# Patient Record
Sex: Female | Born: 1950 | Race: White | Hispanic: No | Marital: Married | Smoking: Former smoker
Health system: Southern US, Community
[De-identification: ages and names within clinical notes are randomized; demographics above are authoritative.]

## PROBLEM LIST (undated history)

## (undated) DIAGNOSIS — I10 Essential (primary) hypertension: Secondary | ICD-10-CM

## (undated) DIAGNOSIS — E28319 Asymptomatic premature menopause: Secondary | ICD-10-CM

## (undated) DIAGNOSIS — E785 Hyperlipidemia, unspecified: Secondary | ICD-10-CM

## (undated) DIAGNOSIS — F329 Major depressive disorder, single episode, unspecified: Secondary | ICD-10-CM

## (undated) DIAGNOSIS — M1990 Unspecified osteoarthritis, unspecified site: Secondary | ICD-10-CM

## (undated) DIAGNOSIS — F32A Depression, unspecified: Secondary | ICD-10-CM

## (undated) HISTORY — DX: Asymptomatic premature menopause: E28.319

## (undated) HISTORY — DX: Unspecified osteoarthritis, unspecified site: M19.90

## (undated) HISTORY — DX: Major depressive disorder, single episode, unspecified: F32.9

## (undated) HISTORY — DX: Essential (primary) hypertension: I10

## (undated) HISTORY — DX: Depression, unspecified: F32.A

## (undated) HISTORY — DX: Hyperlipidemia, unspecified: E78.5

---

## 1975-11-24 HISTORY — PX: OVARIAN CYST SURGERY: SHX726

## 2008-01-31 ENCOUNTER — Ambulatory Visit: Payer: Self-pay

## 2009-04-25 ENCOUNTER — Ambulatory Visit: Payer: Self-pay

## 2009-11-23 HISTORY — PX: COLONOSCOPY: SHX174

## 2009-12-05 LAB — HM COLONOSCOPY

## 2009-12-31 ENCOUNTER — Ambulatory Visit: Payer: Self-pay | Admitting: Family Medicine

## 2010-01-20 ENCOUNTER — Ambulatory Visit: Payer: Self-pay | Admitting: Gastroenterology

## 2010-02-04 ENCOUNTER — Other Ambulatory Visit: Payer: Self-pay | Admitting: Nurse Practitioner

## 2010-06-03 ENCOUNTER — Ambulatory Visit: Payer: Self-pay

## 2010-07-05 LAB — HM PAP SMEAR: HM Pap smear: NORMAL

## 2011-06-17 ENCOUNTER — Ambulatory Visit: Payer: Self-pay | Admitting: Family Medicine

## 2011-07-06 LAB — HM MAMMOGRAPHY: HM Mammogram: NORMAL

## 2011-11-26 ENCOUNTER — Ambulatory Visit: Payer: Self-pay | Admitting: Otolaryngology

## 2012-02-22 HISTORY — PX: NASAL SINUS SURGERY: SHX719

## 2012-06-02 LAB — HM PAP SMEAR: HM Pap smear: NORMAL

## 2012-06-02 LAB — HM MAMMOGRAPHY: HM Mammogram: NORMAL

## 2012-07-05 ENCOUNTER — Encounter: Payer: Self-pay | Admitting: Internal Medicine

## 2012-07-05 ENCOUNTER — Other Ambulatory Visit: Payer: Self-pay | Admitting: Internal Medicine

## 2012-07-05 ENCOUNTER — Ambulatory Visit (INDEPENDENT_AMBULATORY_CARE_PROVIDER_SITE_OTHER): Payer: BC Managed Care – PPO | Admitting: Internal Medicine

## 2012-07-05 VITALS — BP 104/64 | HR 74 | Temp 97.4°F | Ht 65.75 in | Wt 184.8 lb

## 2012-07-05 DIAGNOSIS — I10 Essential (primary) hypertension: Secondary | ICD-10-CM | POA: Insufficient documentation

## 2012-07-05 DIAGNOSIS — M25559 Pain in unspecified hip: Secondary | ICD-10-CM

## 2012-07-05 DIAGNOSIS — F329 Major depressive disorder, single episode, unspecified: Secondary | ICD-10-CM | POA: Insufficient documentation

## 2012-07-05 DIAGNOSIS — E28319 Asymptomatic premature menopause: Secondary | ICD-10-CM | POA: Insufficient documentation

## 2012-07-05 DIAGNOSIS — M25551 Pain in right hip: Secondary | ICD-10-CM | POA: Insufficient documentation

## 2012-07-05 DIAGNOSIS — R490 Dysphonia: Secondary | ICD-10-CM | POA: Insufficient documentation

## 2012-07-05 MED ORDER — VENLAFAXINE HCL ER 75 MG PO CP24: 75.0000 mg | ORAL_CAPSULE | Freq: Every day | ORAL | Status: DC

## 2012-07-05 MED ORDER — AMLODIPINE BESYLATE 5 MG PO TABS: 5.0000 mg | ORAL_TABLET | Freq: Every day | ORAL | Status: DC

## 2012-07-05 MED ORDER — VALSARTAN-HYDROCHLOROTHIAZIDE 320-25 MG PO TABS: 1.0000 | ORAL_TABLET | Freq: Every day | ORAL | Status: DC

## 2012-07-05 NOTE — Assessment & Plan Note (Signed)
Well-controlled on current medications no changes today. Records requested from Dr. Rhunette Croft office. Return in the fall for renal function and recheck.

## 2012-07-05 NOTE — Telephone Encounter (Signed)
Pt forgot to tell dr Darrick Huntsman she needs refill on all her meds [facility] in [place]

## 2012-07-05 NOTE — Assessment & Plan Note (Signed)
Chronic per patient. Given her history of smoking I suggested that she be evaluated. Her she has recently had  multiple ENT evaluations followed by sinus surgery by Dr. Gertie Baron.

## 2012-07-05 NOTE — Assessment & Plan Note (Signed)
Chronic low-level managed with Effexor on a stable dose for the past 8 years. No changes today

## 2012-07-05 NOTE — Assessment & Plan Note (Signed)
Infrequent and episodic aggravated only by long walks on hard surfaces. Range of motion is normal. No further workup at this time.

## 2012-07-05 NOTE — Progress Notes (Signed)
Patient ID: Caitlin Herring, female   DOB: 1950/12/26, 61 y.o.   MRN: [id number]  Patient Active Problem List  Diagnosis  . Early menopause  . Depression  . Hypertension  . Hoarse voice quality  . Hip pain, right    Subjective:  CC:   Chief Complaint  Patient presents with  . Establish Care    HPI:   Caitlin Herring is a 61 y.o. female who presents as a new patient to establish primary care with the chief complaint of   Past Medical History  Diagnosis Date  . Early menopause     at 43   . Depression     managed with effexor, stable dose 8 yrs     Past Surgical History  Procedure Date  . Nasal sinus surgery APRIL 2013    [facility]  . Ovarian cyst surgery 1977    benign    Family History  Problem Relation Age of Onset  . Arthritis Mother   . Hypertension Mother   . Mental illness Mother     depression  . Arthritis Father   . Hypertension Father     History   Social History  . Marital Status: Married    Spouse Name: N/A    Number of Children: N/A  . Years of Education: N/A   Occupational History  . Not on file.   Social History Main Topics  . Smoking status: Current Some Day Smoker    Types: Cigarettes  . Smokeless tobacco: Not on file  . Alcohol Use: No  . Drug Use: Not on file  . Sexually Active: Not on file   Other Topics Concern  . Not on file   Social History Narrative  . No narrative on file   No Known Allergies  Review of Systems: Review of Systems  Constitutional: Negative for fever, chills and weight loss.  HENT: Negative for nosebleeds, sore throat and neck pain.   Eyes: Negative for blurred vision, double vision and redness.  Respiratory: Negative for cough, shortness of breath and wheezing.   Cardiovascular: Negative for chest pain, palpitations, claudication and leg swelling.  Gastrointestinal: Negative for heartburn, abdominal pain and constipation.  Genitourinary: Positive for urgency. Negative for dysuria.    Musculoskeletal: Positive for joint pain. Negative for myalgias and falls.  Skin: Negative for rash.  Neurological: Negative for dizziness, tremors, focal weakness and headaches.  Endo/Heme/Allergies: Does not bruise/bleed easily.  Psychiatric/Behavioral: Negative for suicidal ideas and memory loss. The patient does not have insomnia.       Objective:  BP 104/64  Pulse 74  Temp 97.4 F (36.3 C) (Oral)  Ht 5' 5.75" (1.67 m)  Wt 184 lb 12 oz (83.802 kg)  BMI 30.05 kg/m2  SpO2 97%  General appearance: alert, cooperative and appears stated age Ears: normal TM's and external ear canals both ears Throat: lips, mucosa, and tongue normal; teeth and gums normal Neck: no adenopathy, no carotid bruit, supple, symmetrical, trachea midline and thyroid not enlarged, symmetric, no tenderness/mass/nodules Back: symmetric, no curvature. ROM normal. No CVA tenderness. Lungs: clear to auscultation bilaterally Heart: regular rate and rhythm, S1, S2 normal, no murmur, click, rub or gallop Abdomen: soft, non-tender; bowel sounds normal; no masses,  no organomegaly Pulses: 2+ and symmetric Skin: Skin color, texture, turgor normal. No rashes or lesions Lymph nodes: Cervical, supraclavicular, and axillary nodes normal.  Assessment and Plan:  Hypertension Well-controlled on current medications no changes today. Records requested from Dr. Rhunette Croft office. Return in the  fall for renal function and recheck.  Depression Chronic low-level managed with Effexor on a stable dose for the past 8 years. No changes today  Hoarse voice quality Chronic per patient. Given her history of smoking I suggested that she be evaluated. Her she has recently had  multiple ENT evaluations followed by sinus surgery by Dr. Gertie Baron.  Hip pain, right Infrequent and episodic aggravated only by long walks on hard surfaces. Range of motion is normal. No further workup at this time.   Updated Medication  List Outpatient Encounter Prescriptions as of 07/05/2012  Medication Sig Dispense Refill  . calcium carbonate (OS-CAL) 600 MG TABS Take 600 mg by mouth daily.      . cholecalciferol (VITAMIN D) 1000 UNITS tablet Take 1,000 Units by mouth daily.      . Coenzyme Q10 (COQ-10) 200 MG CAPS Take 1 capsule by mouth daily.      . Multiple Vitamin (MULTIVITAMIN) tablet Take 1 tablet by mouth daily.      Marland Kitchen DISCONTD: amLODipine (NORVASC) 5 MG tablet Take 5 mg by mouth daily.      Marland Kitchen DISCONTD: valsartan-hydrochlorothiazide (DIOVAN-HCT) 320-25 MG per tablet Take 1 tablet by mouth daily.      Marland Kitchen DISCONTD: venlafaxine XR (EFFEXOR-XR) 75 MG 24 hr capsule Take 75 mg by mouth daily.         Orders Placed This Encounter  Procedures  . HM MAMMOGRAPHY  . HM DEXA SCAN  . HM PAP SMEAR  . HM COLONOSCOPY    No Follow-up on file.

## 2012-07-05 NOTE — Patient Instructions (Addendum)
Return in 6 months for next visit,  Fasting labs 2 days prior    Consider a Low Glycemic Index Diet and eating 6 smaller meals daily .  This frequent feeding stimulates your metabolism and the lower glycemic index foods will lower your blood sugars:   This is an example of my daily  "Low GI"  Diet:  All of the foods can be found at grocery stores and in bulk at [employer]  club   7 AM Breakfast:  Low carbohydrate Protein  Shakes (I recommend the EAS AdvantEdge "Carb Control" shakes  Or the low carb shakes by Atkins.   Both are available everywhere:  In  cases at [employer]Js  Or in 4 packs at grocery stores and pharmacies  2.5 carbs  (Alternative is  a toasted Arnold's Sandwhich Thin w/ peanut butter, a "Bagel Thin" with cream cheese and salmon) or  a scrambled egg burrito made with a low carb tortilla .  Avoid cereal and bananas, oatmeal too unless the old fashioned kind that takes 30-40 minutes to prepare.  the rest is overly processed, has minimal fiber, and loaded with carbohydrates!   10 AM: Protein bar by Atkins (the snack size, under 200 cal.  There are many varieties , available widely again or in bulk in limited varieties at [employer]Js)  Other so called "protein bars" tend to be loaded with carbohydrates.  Remember, in food advertising, the word "energy" is synonymous for " carbohydrate."  Lunch: sandwich of [place], (or any lunchmeat or canned tuna), fresh avocado and cheese on a lower carbohydrate pita bread, flatbread, or tortilla . Ok to use mayonnaise. The bread is the only source or carbohydrate that can be decreased (Joseph's makes a pita bread and a flat bread  Are 50 cal and 4 net carbs ; Toufayan makes a low carb flatbread 100 cal and 9 net carbs  and  Mission makes a low carb whole wheat tortilla  210 cal and 6 net carbs)  3 PM:  Mid day :  Another protein bar,  Or a  cheese stick (100 cal, 0 carbs),  Or 1 ounce of  almonds, walnuts, pistachios, pecans, peanuts,  Macadamia nuts. Or a Dannon light n Fit  greek yogurt, 80 cal 8 net carbs . Avoid "granola"; the dried cranberries and raisins are loaded with carbohydrates.    6 PM  Dinner:  "mean and green:"  Meat/chicken/fish or a high protein legume; , with a green salad, and a low GI  Veggie (broccoli, cauliflower, green beans, spinach, brussel sprouts. Lima beans) : Avoid "Low fat dressings, Reyne Dumas and [address]! They are loaded with sugar! Instead use ranch, vinagrette,  Blue cheese, etc  9 PM snack : Breyer's "low carb" fudgsicle or  ice cream bar (Carb Smart line), or  Weight Watcher's ice cream bar , or anouther "no sugar added" ice cream; or another protein shake or a serving of fresh fruit with whipped cream (Avoid bananas, pineapple, grapes  and watermelon on a regular basis because they are high in sugar)   Remember that snack Substitutions should be less than 15 to 20 carbs  Per serving. Remember to subtract fiber grams to get the "net carbs."

## 2012-08-01 LAB — HM PAP SMEAR: HM Pap smear: NORMAL

## 2012-09-27 ENCOUNTER — Ambulatory Visit: Payer: Self-pay

## 2013-01-03 ENCOUNTER — Telehealth: Payer: Self-pay | Admitting: *Deleted

## 2013-01-03 DIAGNOSIS — I10 Essential (primary) hypertension: Secondary | ICD-10-CM

## 2013-01-03 DIAGNOSIS — Z13220 Encounter for screening for lipoid disorders: Secondary | ICD-10-CM

## 2013-01-03 DIAGNOSIS — R5381 Other malaise: Secondary | ICD-10-CM

## 2013-01-03 NOTE — Addendum Note (Signed)
Addended by: Sherlene Shams on: 01/03/2013 12:27 PM   Modules accepted: Orders

## 2013-01-03 NOTE — Telephone Encounter (Signed)
Pt is coming in for labs tomorrow (02.12.2014) what labs and dx would you like?

## 2013-01-03 NOTE — Telephone Encounter (Signed)
Orders in thanks

## 2013-01-04 ENCOUNTER — Other Ambulatory Visit (INDEPENDENT_AMBULATORY_CARE_PROVIDER_SITE_OTHER): Payer: BC Managed Care – PPO

## 2013-01-04 DIAGNOSIS — I10 Essential (primary) hypertension: Secondary | ICD-10-CM

## 2013-01-04 DIAGNOSIS — R5381 Other malaise: Secondary | ICD-10-CM

## 2013-01-04 DIAGNOSIS — R5383 Other fatigue: Secondary | ICD-10-CM

## 2013-01-04 DIAGNOSIS — Z13220 Encounter for screening for lipoid disorders: Secondary | ICD-10-CM

## 2013-01-04 LAB — COMPREHENSIVE METABOLIC PANEL
ALT: 36 U/L — ABNORMAL HIGH (ref 0–35)
Alkaline Phosphatase: 62 U/L (ref 39–117)
Sodium: 137 mEq/L (ref 135–145)
Total Bilirubin: 0.6 mg/dL (ref 0.3–1.2)
Total Protein: 7.2 g/dL (ref 6.0–8.3)

## 2013-01-04 LAB — LIPID PANEL
HDL: 45.6 mg/dL (ref >39.00)
Total CHOL/HDL Ratio: 6
VLDL: 30.4 mg/dL (ref 0.0–40.0)

## 2013-01-06 ENCOUNTER — Encounter: Payer: BC Managed Care – PPO | Admitting: Internal Medicine

## 2013-01-18 ENCOUNTER — Other Ambulatory Visit: Payer: Self-pay | Admitting: *Deleted

## 2013-01-19 MED ORDER — VALSARTAN-HYDROCHLOROTHIAZIDE 320-25 MG PO TABS: 1.0000 | ORAL_TABLET | Freq: Every day | ORAL | Status: DC

## 2013-01-31 ENCOUNTER — Ambulatory Visit (INDEPENDENT_AMBULATORY_CARE_PROVIDER_SITE_OTHER): Payer: BC Managed Care – PPO | Admitting: Internal Medicine

## 2013-01-31 ENCOUNTER — Encounter: Payer: Self-pay | Admitting: Internal Medicine

## 2013-01-31 VITALS — BP 108/62 | HR 71 | Temp 98.1°F | Resp 16 | Ht 67.0 in | Wt 187.8 lb

## 2013-01-31 DIAGNOSIS — M25559 Pain in unspecified hip: Secondary | ICD-10-CM

## 2013-01-31 DIAGNOSIS — I10 Essential (primary) hypertension: Secondary | ICD-10-CM

## 2013-01-31 DIAGNOSIS — E119 Type 2 diabetes mellitus without complications: Secondary | ICD-10-CM | POA: Insufficient documentation

## 2013-01-31 DIAGNOSIS — E785 Hyperlipidemia, unspecified: Secondary | ICD-10-CM

## 2013-01-31 DIAGNOSIS — Z0000 Encounter for general adult medical examination without abnormal findings: Secondary | ICD-10-CM

## 2013-01-31 DIAGNOSIS — R7309 Other abnormal glucose: Secondary | ICD-10-CM

## 2013-01-31 LAB — COMPREHENSIVE METABOLIC PANEL
AST: 25 U/L (ref 0–37)
Albumin: 4.3 g/dL (ref 3.5–5.2)
Alkaline Phosphatase: 63 U/L (ref 39–117)
BUN: 19 mg/dL (ref 6–23)
Potassium: 4.4 mEq/L (ref 3.5–5.1)
Sodium: 137 mEq/L (ref 135–145)
Total Bilirubin: 0.9 mg/dL (ref 0.3–1.2)

## 2013-01-31 LAB — LIPID PANEL
Total CHOL/HDL Ratio: 6
Triglycerides: 274 mg/dL — ABNORMAL HIGH (ref 0.0–149.0)
VLDL: 54.8 mg/dL — ABNORMAL HIGH (ref 0.0–40.0)

## 2013-01-31 LAB — MICROALBUMIN / CREATININE URINE RATIO: Microalb, Ur: 0.9 mg/dL (ref 0.0–1.9)

## 2013-01-31 LAB — LDL CHOLESTEROL, DIRECT: Direct LDL: 182.8 mg/dL

## 2013-01-31 NOTE — Progress Notes (Signed)
Patient ID: Caitlin Herring, female   DOB: July 30, 1951, 62 y.o.   MRN: [id number]   Subjective:     Caitlin Herring is a 62 y.o. female and is here for a comprehensive physical exam. The patient reports no problems.  History   Social History  . Marital Status: Married    Spouse Name: N/A    Number of Children: N/A  . Years of Education: N/A   Occupational History  . Not on file.   Social History Main Topics  . Smoking status: Current Some Day Smoker    Types: Cigarettes  . Smokeless tobacco: Not on file  . Alcohol Use: No  . Drug Use: Not on file  . Sexually Active: Not on file   Other Topics Concern  . Not on file   Social History Narrative  . No narrative on file   Health Maintenance  Topic Date Due  . Influenza Vaccine  07/24/1950  . Tetanus/tdap  01/01/1969  . Zostavax  01/01/2010  . Mammogram  07/05/2013  . Pap Smear  07/05/2013  . Colonoscopy  12/06/2019    The following portions of the patient's history were reviewed and updated as appropriate: allergies, current medications, past family history, past medical history, past social history, past surgical history and problem list.  Review of Systems A comprehensive review of systems was negative.   Objective:   BP 108/62  Pulse 71  Temp(Src) 98.1 F (36.7 C) (Oral)  Resp 16  Ht 5\' 7"  (1.702 m)  Wt 187 lb 12 oz (85.163 kg)  BMI 29.4 kg/m2  SpO2 96%  General appearance: alert, cooperative and appears stated age Ears: normal TM's and external ear canals both ears Throat: lips, mucosa, and tongue normal; teeth and gums normal Neck: no adenopathy, no carotid bruit, supple, symmetrical, trachea midline and thyroid not enlarged, symmetric, no tenderness/mass/nodules Back: symmetric, no curvature. ROM normal. No CVA tenderness. Lungs: clear to auscultation bilaterally Heart: regular rate and rhythm, S1, S2 normal, no murmur, click, rub or gallop Abdomen: soft, non-tender; bowel sounds normal; no masses,  no  organomegaly Pulses: 2+ and symmetric Skin: Skin color, texture, turgor normal. No rashes or lesions Lymph nodes: Cervical, supraclavicular, and axillary nodes normal.   Assessment:   Hip pain, right Improved, managed with aleve 2 daily .  Walking more   Other and unspecified hyperlipidemia Addressed today.  Will repeat in 3 months after diet and exercise.   Blood glucose elevated a1c is 6.2 I have addressed  BMI and recommended a low glycemic index diet utilizing smaller more frequent meals to increase metabolism.  I have also recommended that patient start exercising with a goal of 30 minutes of aerobic exercise a minimum of 5 days per week. Screening for lipid disorders, thyroid and diabetes to be done today.    Routine general medical examination at a health care facility Annual comprehensive exam was done excluding breast, pelvic and PAP smear. All screenings have been addressed .   Hypertension Well controlled on current regimen. Renal function stable, no changes today.   Updated Medication List Outpatient Encounter Prescriptions as of 01/31/2013  Medication Sig Dispense Refill  . amLODipine (NORVASC) 5 MG tablet Take 1 tablet (5 mg total) by mouth daily.  30 tablet  6  . calcium carbonate (OS-CAL) 600 MG TABS Take 600 mg by mouth daily.      . cholecalciferol (VITAMIN D) 1000 UNITS tablet Take 1,000 Units by mouth daily.      . Coenzyme Q10 (  COQ-10) 200 MG CAPS Take 1 capsule by mouth daily.      . Multiple Vitamin (MULTIVITAMIN) tablet Take 1 tablet by mouth daily.      . Naproxen Sodium 220 MG CAPS Take 2 capsules by mouth daily.      . valsartan-hydrochlorothiazide (DIOVAN-HCT) 320-25 MG per tablet Take 1 tablet by mouth daily.  30 tablet  6  . venlafaxine XR (EFFEXOR-XR) 75 MG 24 hr capsule Take 1 capsule (75 mg total) by mouth daily.  30 capsule  2  . [DISCONTINUED] amLODipine (NORVASC) 5 MG tablet Take 1 tablet (5 mg total) by mouth daily.  30 tablet  4  .  [DISCONTINUED] valsartan-hydrochlorothiazide (DIOVAN-HCT) 320-25 MG per tablet Take 1 tablet by mouth daily.  30 tablet  6   No facility-administered encounter medications on file as of 01/31/2013.

## 2013-01-31 NOTE — Assessment & Plan Note (Signed)
Improved, managed with aleve 2 daily .  Walking more

## 2013-01-31 NOTE — Assessment & Plan Note (Signed)
a1c is 6.2 I have addressed  BMI and recommended a low glycemic index diet utilizing smaller more frequent meals to increase metabolism.  I have also recommended that patient start exercising with a goal of 30 minutes of aerobic exercise a minimum of 5 days per week. Screening for lipid disorders, thyroid and diabetes to be done today.

## 2013-01-31 NOTE — Assessment & Plan Note (Signed)
Annual comprehensive exam was done excluding breast, pelvic and PAP smear. All screenings have been addressed .  

## 2013-01-31 NOTE — Assessment & Plan Note (Addendum)
Addressed today.  Will repeat in 3 months after diet and exercise.

## 2013-01-31 NOTE — Patient Instructions (Addendum)
You can lower your BMI  by losing 10%  Of your current body weight over the next 6 months   We will repeat your cholesterol and Hgba1c in 3 months   This is  One version of a  "Low GI"  Diet:  It is not ultra low carb, but will still lower your blood sugars and allow you to lose 5 to 15lbs per month if you follow it carefully and combine it with 30 minutes of aeroric exercise 5 days per week .   All of the foods can be found at grocery stores and in bulk at [employer].  The Atkins protein bars and shakes are available in more varieties at [employer], [employer] and [employer].     7 AM Breakfast:  Low carbohydrate Protein  Shakes (I recommend the EAS AdvantEdge "Carb Control" shakes  Or the low carb shakes by Atkins.   Both are available everywhere:  In  cases at BJs  Or in 4 packs at grocery stores and pharmacies  2.5 carbs  (Alternative is  a toasted Arnold's Sandwhich Thin w/ peanut butter, a "Bagel Thin" with cream cheese and salmon) or  a scrambled egg burrito made with a low carb tortilla .  Avoid cereal and bananas, oatmeal too unless you are cooking the old fashioned kind that takes 30-40 minutes to prepare.  the rest is overly processed, has minimal fiber, and is loaded with carbohydrates!   10 AM: Protein bar by Atkins (the snack size, under 200 cal).  There are many varieties , available widely again or in bulk in limited varieties at BJs)  Other so called "protein bars" tend to be loaded with carbohydrates.  Remember, in food advertising, the word "energy" is synonymous for " carbohydrate."  Lunch: sandwich of [place], (or any lunchmeat, grilled meat or canned tuna), fresh avocado, mayonnaise  and cheese on a lower carbohydrate pita bread, flatbread, or tortilla . Ok to use regular mayonnaise. The bread is the only source or carbohydrate that can be decreased (Joseph's makes a pita bread and a flat bread that are 50 cal and 4 net carbs ; Toufayan makes a low carb flatbread that's 100 cal and 9 net  carbs  and  Mission makes a low carb whole wheat tortilla  That is 210 cal and 6 net carbs)  3 PM:  Mid day :  Another protein bar,  Or a  cheese stick (100 cal, 0 carbs),  Or 1 ounce of  almonds, walnuts, pistachios, pecans, peanuts,  Macadamia nuts. Or a Dannon light n Fit greek yogurt, 80 cal 8 net carbs . Avoid "granola"; the dried cranberries and raisins are loaded with carbohydrates. Mixed nuts ok if no raisins or cranberries or dried fruit.      6 PM  Dinner:  "mean and green:"  Meat/chicken/fish or a high protein legume; , with a green salad, and a low GI  Veggie (broccoli, cauliflower, green beans, spinach, brussel sprouts. Lima beans) : Avoid "Low fat dressings, as well as Reyne Dumas and [address]! They are loaded with sugar! Instead use ranch, vinagrette,  Blue cheese, etc.  There is a low carb pasta by Dreamfield's available at [facility] that is acceptable and tastes great. Try Michel Angelo's chicken piccata over low carb pasta. The chicken dish is 0 carbs, and can be found in frozen section at [employer]Js and [employer]. Also try [employer]" (pulled pork, no sauce,  0 carbs) and his pot roast.  both are in the refrigerated section at BJs   Dreamfield's makes a low carb pasta only 5 g/serving.  Available at all grocery stores,  And tastes like normal pasta  9 PM snack : Breyer's "low carb" fudgsicle or  ice cream bar (Carb Smart line), or  Weight Watcher's ice cream bar , or another "no sugar added" ice cream;a serving of fresh berries/cherries with whipped cream (Avoid bananas, pineapple, grapes  and watermelon on a regular basis because they are high in sugar)   Remember that snack Substitutions should be less than 10 carbs per serving and meals < 20 carbs. Remember to subtract fiber grams and sugar alcohols to get the "net carbs."

## 2013-01-31 NOTE — Assessment & Plan Note (Signed)
Well controlled on current regimen. Renal function stable, no changes today. 

## 2013-02-01 ENCOUNTER — Encounter: Payer: Self-pay | Admitting: Internal Medicine

## 2013-02-01 NOTE — Addendum Note (Signed)
Addended by: Sherlene Shams on: 02/01/2013 01:15 PM   Modules accepted: Orders

## 2013-02-07 ENCOUNTER — Other Ambulatory Visit: Payer: Self-pay | Admitting: Internal Medicine

## 2013-02-09 ENCOUNTER — Other Ambulatory Visit: Payer: Self-pay | Admitting: Internal Medicine

## 2013-02-09 NOTE — Telephone Encounter (Signed)
Phoned in on 3/20.

## 2013-02-09 NOTE — Telephone Encounter (Signed)
Med filled.  

## 2013-05-02 ENCOUNTER — Other Ambulatory Visit: Payer: BC Managed Care – PPO

## 2013-05-05 ENCOUNTER — Ambulatory Visit: Payer: BC Managed Care – PPO | Admitting: Internal Medicine

## 2013-05-10 ENCOUNTER — Other Ambulatory Visit: Payer: Self-pay | Admitting: *Deleted

## 2013-05-10 MED ORDER — AMLODIPINE BESYLATE 5 MG PO TABS: ORAL_TABLET | ORAL | Status: DC

## 2013-05-29 ENCOUNTER — Other Ambulatory Visit (INDEPENDENT_AMBULATORY_CARE_PROVIDER_SITE_OTHER): Payer: BC Managed Care – PPO

## 2013-05-29 DIAGNOSIS — E119 Type 2 diabetes mellitus without complications: Secondary | ICD-10-CM

## 2013-05-29 DIAGNOSIS — E785 Hyperlipidemia, unspecified: Secondary | ICD-10-CM

## 2013-05-29 LAB — COMPREHENSIVE METABOLIC PANEL
ALT: 33 U/L (ref 0–35)
AST: 27 U/L (ref 0–37)
Albumin: 4.4 g/dL (ref 3.5–5.2)
Alkaline Phosphatase: 68 U/L (ref 39–117)
BUN: 22 mg/dL (ref 6–23)
Calcium: 10 mg/dL (ref 8.4–10.5)
Chloride: 103 mEq/L (ref 96–112)
Potassium: 4.3 mEq/L (ref 3.5–5.1)
Sodium: 138 mEq/L (ref 135–145)

## 2013-05-29 LAB — HEMOGLOBIN A1C: Hgb A1c MFr Bld: 6.2 % (ref 4.6–6.5)

## 2013-05-29 LAB — LIPID PANEL
Total CHOL/HDL Ratio: 6
VLDL: 32.4 mg/dL (ref 0.0–40.0)

## 2013-05-31 ENCOUNTER — Encounter: Payer: BC Managed Care – PPO | Admitting: Internal Medicine

## 2013-06-07 ENCOUNTER — Ambulatory Visit (INDEPENDENT_AMBULATORY_CARE_PROVIDER_SITE_OTHER): Payer: BC Managed Care – PPO | Admitting: Internal Medicine

## 2013-06-07 ENCOUNTER — Encounter: Payer: Self-pay | Admitting: Internal Medicine

## 2013-06-07 VITALS — BP 108/74 | HR 68 | Temp 98.1°F | Resp 14 | Wt 179.8 lb

## 2013-06-07 DIAGNOSIS — R739 Hyperglycemia, unspecified: Secondary | ICD-10-CM

## 2013-06-07 DIAGNOSIS — E785 Hyperlipidemia, unspecified: Secondary | ICD-10-CM

## 2013-06-07 DIAGNOSIS — E663 Overweight: Secondary | ICD-10-CM

## 2013-06-07 DIAGNOSIS — R7309 Other abnormal glucose: Secondary | ICD-10-CM

## 2013-06-07 DIAGNOSIS — I10 Essential (primary) hypertension: Secondary | ICD-10-CM

## 2013-06-07 DIAGNOSIS — Z6825 Body mass index (BMI) 25.0-25.9, adult: Secondary | ICD-10-CM

## 2013-06-07 LAB — HM DIABETES FOOT EXAM: HM Diabetic Foot Exam: NORMAL

## 2013-06-07 MED ORDER — AMLODIPINE BESYLATE 5 MG PO TABS: ORAL_TABLET | ORAL | Status: DC

## 2013-06-07 MED ORDER — RED YEAST RICE 600 MG PO CAPS: 1.0000 | ORAL_CAPSULE | Freq: Two times a day (BID) | ORAL | Status: DC

## 2013-06-07 NOTE — Patient Instructions (Addendum)
You are doing great !!!!  I want you to try red yeast rice for your cholesterol 600 mg twice daily  For the next 3 months

## 2013-06-09 ENCOUNTER — Encounter: Payer: Self-pay | Admitting: Internal Medicine

## 2013-06-09 DIAGNOSIS — E663 Overweight: Secondary | ICD-10-CM | POA: Insufficient documentation

## 2013-06-09 NOTE — Progress Notes (Signed)
Patient ID: Caitlin Herring, female   DOB: 09-04-1951, 62 y.o.   MRN: [id number]   Patient Active Problem List   Diagnosis Date Noted  . Other and unspecified hyperlipidemia 01/31/2013  . Type II or unspecified type diabetes mellitus without mention of complication, not stated as uncontrolled 01/31/2013  . Routine general medical examination at a health care facility 01/31/2013  . Hypertension 07/05/2012  . Hoarse voice quality 07/05/2012  . Hip pain, right 07/05/2012  . Early menopause   . Depression     Subjective:  CC:   Chief Complaint  Patient presents with  . Follow-up    3 month    HPI:   Caitlin Herring a 62 y.o. female who presents for  Followup on hypertension, borderline diabetes/glucose intolerance and overweight. She has been losing weight intentionally with a 8 lb wt loss since april by following a low glycemic index diet and exercising regularly. She denies any joint pain, trouble sleeping, changes in bowel habits, medication intolerances, and abdominal pain.    Past Medical History  Diagnosis Date  . Early menopause     at 62   . Depression     managed with effexor, stable dose 8 yrs     Past Surgical History  Procedure Laterality Date  . Nasal sinus surgery  APRIL 2013    [facility]  . Ovarian cyst surgery  1977    benign       The following portions of the patient's history were reviewed and updated as appropriate: Allergies, current medications, and problem list.    Review of Systems:   12 Pt  review of systems was negative except those addressed in the HPI,     History   Social History  . Marital Status: Married    Spouse Name: N/A    Number of Children: N/A  . Years of Education: N/A   Occupational History  . Not on file.   Social History Main Topics  . Smoking status: Current Some Day Smoker    Types: Cigarettes  . Smokeless tobacco: Not on file  . Alcohol Use: No  . Drug Use: Not on file  . Sexually Active: Not on  file   Other Topics Concern  . Not on file   Social History Narrative  . No narrative on file    Objective:  BP 108/74  Pulse 68  Temp(Src) 98.1 F (36.7 C) (Oral)  Resp 14  Wt 179 lb 12 oz (81.534 kg)  BMI 28.15 kg/m2  SpO2 97%  General appearance: alert, cooperative and appears stated age Ears: normal TM's and external ear canals both ears Throat: lips, mucosa, and tongue normal; teeth and gums normal Neck: no adenopathy, no carotid bruit, supple, symmetrical, trachea midline and thyroid not enlarged, symmetric, no tenderness/mass/nodules Back: symmetric, no curvature. ROM normal. No CVA tenderness. Lungs: clear to auscultation bilaterally Heart: regular rate and rhythm, S1, S2 normal, no murmur, click, rub or gallop Abdomen: soft, non-tender; bowel sounds normal; no masses,  no organomegaly Pulses: 2+ and symmetric Skin: Skin color, texture, turgor normal. No rashes or lesions Lymph nodes: Cervical, supraclavicular, and axillary nodes normal. Foot exam:  Nails are well trimmed,  No callouses,  Sensation intact to microfilament   Assessment and Plan:  Other and unspecified hyperlipidemia Her LDL has risen despite reducing her triglycerides in losing weight. We discussed a trial of red yeast rice, followed by repeat fasting lipids in 3 months and escalation to statin if still elevated.  Hypertension Well controlled on current regimen. Renal function stable, no changes today.  Type II or unspecified type diabetes mellitus without mention of complication, not stated as uncontrolled Her fasting glucose was diagnostic at 1:30. Her A1c remains stable at 6.2. We are addressing her hyperlipidemia and her overweight with weight loss, low glycemic index diet, and red yeast rice. If LDL remains above 100 in 3 months we will recommend statin therapy. Daily aspirin recommended. She has had her annual eye exam.  Overweight (BMI 25.0-29.9) I have addressed  BMI and congratulated her  he ron wt loss.  Recommended a low glycemic index diet utilizing smaller more frequent meals to increase metabolism.  I have also recommended that patient start exercising with a goal of 30 minutes of aerobic exercise a minimum of 5 days per week.    Updated Medication List Outpatient Encounter Prescriptions as of 06/07/2013  Medication Sig Dispense Refill  . amLODipine (NORVASC) 5 MG tablet TAKE 1 TABLET BY MOUTH EVERY DAY  90 tablet  3  . calcium carbonate (OS-CAL) 600 MG TABS Take 600 mg by mouth daily.      . cholecalciferol (VITAMIN D) 1000 UNITS tablet Take 1,000 Units by mouth daily.      . Coenzyme Q10 (COQ-10) 200 MG CAPS Take 1 capsule by mouth daily.      . Multiple Vitamin (MULTIVITAMIN) tablet Take 1 tablet by mouth daily.      . Naproxen Sodium 220 MG CAPS Take 2 capsules by mouth daily.      . valsartan-hydrochlorothiazide (DIOVAN-HCT) 320-25 MG per tablet Take 1 tablet by mouth daily.  30 tablet  6  . venlafaxine XR (EFFEXOR-XR) 75 MG 24 hr capsule TAKE ONE CAPSULE BY MOUTH DAILY  90 capsule  0  . [DISCONTINUED] amLODipine (NORVASC) 5 MG tablet TAKE 1 TABLET BY MOUTH EVERY DAY  30 tablet  5  . Red Yeast Rice 600 MG CAPS Take 1 capsule (600 mg total) by mouth 2 (two) times daily.  180 capsule  0   No facility-administered encounter medications on file as of 06/07/2013.

## 2013-06-09 NOTE — Assessment & Plan Note (Signed)
Her LDL has risen despite reducing her triglycerides in losing weight. We discussed a trial of red yeast rice, followed by repeat fasting lipids in 3 months and escalation to statin if still elevated.

## 2013-06-09 NOTE — Assessment & Plan Note (Signed)
Well controlled on current regimen. Renal function stable, no changes today. 

## 2013-06-09 NOTE — Assessment & Plan Note (Signed)
I have addressed  BMI and congratulated her he ron wt loss.  Recommended a low glycemic index diet utilizing smaller more frequent meals to increase metabolism.  I have also recommended that patient start exercising with a goal of 30 minutes of aerobic exercise a minimum of 5 days per week.

## 2013-06-09 NOTE — Assessment & Plan Note (Addendum)
Her fasting glucose was diagnostic at 1:30. Her A1c remains stable at 6.2. We are addressing her hyperlipidemia and her overweight with weight loss, low glycemic index diet, and red yeast rice. If LDL remains above 100 in 3 months we will recommend statin therapy. Daily aspirin recommended. She has had her annual eye exam.

## 2013-08-03 ENCOUNTER — Other Ambulatory Visit: Payer: Self-pay | Admitting: Internal Medicine

## 2013-09-05 ENCOUNTER — Other Ambulatory Visit: Payer: BC Managed Care – PPO

## 2013-09-05 NOTE — Progress Notes (Signed)
This encounter was created in error - please disregard.

## 2013-09-11 ENCOUNTER — Ambulatory Visit: Payer: BC Managed Care – PPO | Admitting: Internal Medicine

## 2013-09-11 ENCOUNTER — Other Ambulatory Visit: Payer: BC Managed Care – PPO

## 2013-09-15 ENCOUNTER — Other Ambulatory Visit (INDEPENDENT_AMBULATORY_CARE_PROVIDER_SITE_OTHER): Payer: BC Managed Care – PPO

## 2013-09-15 DIAGNOSIS — R7309 Other abnormal glucose: Secondary | ICD-10-CM

## 2013-09-15 DIAGNOSIS — E663 Overweight: Secondary | ICD-10-CM

## 2013-09-15 DIAGNOSIS — R739 Hyperglycemia, unspecified: Secondary | ICD-10-CM

## 2013-09-15 DIAGNOSIS — Z6825 Body mass index (BMI) 25.0-25.9, adult: Secondary | ICD-10-CM

## 2013-09-15 DIAGNOSIS — E119 Type 2 diabetes mellitus without complications: Secondary | ICD-10-CM

## 2013-09-15 DIAGNOSIS — E785 Hyperlipidemia, unspecified: Secondary | ICD-10-CM

## 2013-09-15 LAB — COMPREHENSIVE METABOLIC PANEL
AST: 28 U/L (ref 0–37)
Albumin: 4.8 g/dL (ref 3.5–5.2)
Alkaline Phosphatase: 64 U/L (ref 39–117)
Chloride: 102 mEq/L (ref 96–112)
Potassium: 5.1 mEq/L (ref 3.5–5.1)
Sodium: 139 mEq/L (ref 135–145)
Total Protein: 8 g/dL (ref 6.0–8.3)

## 2013-09-15 LAB — LIPID PANEL: Cholesterol: 287 mg/dL — ABNORMAL HIGH (ref 0–200)

## 2013-09-17 ENCOUNTER — Encounter: Payer: Self-pay | Admitting: Internal Medicine

## 2013-09-18 ENCOUNTER — Ambulatory Visit (INDEPENDENT_AMBULATORY_CARE_PROVIDER_SITE_OTHER): Payer: BC Managed Care – PPO | Admitting: Internal Medicine

## 2013-09-18 ENCOUNTER — Encounter: Payer: Self-pay | Admitting: Internal Medicine

## 2013-09-18 VITALS — BP 142/84 | HR 76 | Temp 98.3°F | Resp 12 | Ht 67.0 in | Wt 179.8 lb

## 2013-09-18 DIAGNOSIS — I10 Essential (primary) hypertension: Secondary | ICD-10-CM

## 2013-09-18 DIAGNOSIS — E785 Hyperlipidemia, unspecified: Secondary | ICD-10-CM

## 2013-09-18 DIAGNOSIS — Z6825 Body mass index (BMI) 25.0-25.9, adult: Secondary | ICD-10-CM

## 2013-09-18 DIAGNOSIS — E119 Type 2 diabetes mellitus without complications: Secondary | ICD-10-CM

## 2013-09-18 DIAGNOSIS — Z1239 Encounter for other screening for malignant neoplasm of breast: Secondary | ICD-10-CM

## 2013-09-18 DIAGNOSIS — E663 Overweight: Secondary | ICD-10-CM

## 2013-09-18 MED ORDER — ATORVASTATIN CALCIUM 20 MG PO TABS: 20.0000 mg | ORAL_TABLET | Freq: Every day | ORAL | Status: DC

## 2013-09-18 MED ORDER — ZOSTER VACCINE LIVE 19400 UNT/0.65ML ~~LOC~~ SOLR: 0.6500 mL | Freq: Once | SUBCUTANEOUS | Status: AC

## 2013-09-18 NOTE — Progress Notes (Signed)
Patient ID: Caitlin Herring, female   DOB: 1951/10/21, 62 y.o.   MRN: [id number]   Patient Active Problem List   Diagnosis Date Noted  . Overweight (BMI 25.0-29.9) 06/09/2013  . Other and unspecified hyperlipidemia 01/31/2013  . Type II or unspecified type diabetes mellitus without mention of complication, not stated as uncontrolled 01/31/2013  . Routine general medical examination at a health care facility 01/31/2013  . Hypertension 07/05/2012  . Hoarse voice quality 07/05/2012  . Hip pain, right 07/05/2012  . Early menopause   . Depression     Subjective:  CC:   Chief Complaint  Patient presents with  . Follow-up    3 month    HPI:   Caitlin Herring a 62 y.o. female who presents for follow up on chronic issues including tobacco abuse, diet controlled diabetes and overweight.  She has history cigarette use from 4 today down to the day. 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   Past Medical History  Diagnosis Date  . Early menopause     at 54   . Depression     managed with effexor, stable dose 8 yrs     Past Surgical History  Procedure Laterality Date  . Nasal sinus surgery  APRIL 2013    [facility]  . Ovarian cyst surgery  1977    benign       The following portions of the patient's history were reviewed and updated as appropriate: Allergies, current medications, and problem list.    Review of Systems:   12 Pt  review of systems was negative except those addressed in the HPI,     History   Social History  . Marital Status: Married    Spouse Name: [name]    Number of Children: 2  . Years of Education: N/A   Occupational History  . librarian    Social History Main Topics  . Smoking status: Current Some Day Smoker -- 0.05 packs/day    Types: Cigarettes  . Smokeless tobacco: Not on file  . Alcohol Use: No  . Drug Use: Not on file  . Sexual  Activity: Not on file   Other Topics Concern  . Not on file   Social History Narrative  . No narrative on file    Objective:  Filed Vitals:   09/18/13 0847  BP: 142/84  Pulse: 76  Temp: 98.3 F (36.8 C)  Resp: 12     General appearance: alert, cooperative and appears stated age Ears: normal TM's and external ear canals both ears Throat: lips, mucosa, and tongue normal; teeth and gums normal Neck: no adenopathy, no carotid bruit, supple, symmetrical, trachea midline and thyroid not enlarged, symmetric, no tenderness/mass/nodules Back: symmetric, no curvature. ROM normal. No CVA tenderness. Lungs: clear to auscultation bilaterally Heart: regular rate and rhythm, S1, S2 normal, no murmur, click, rub or gallop Abdomen: soft, non-tender; bowel sounds normal; no masses,  no organomegaly Pulses: 2+ and symmetric Skin: Skin color, texture, turgor normal. No rashes or lesions Lymph nodes: Cervical, supraclavicular, and axillary nodes normal.  Assessment and Plan:  Other and unspecified hyperlipidemia 10 yr risk of CAD elevated based on Framingham risk calcaution. Statin therapy advised.  Trial of lipitor .  Return in 3 months   Type II or unspecified type diabetes mellitus without mention of complication, not stated as  uncontrolled diagnosed with fasting glucose of 126.    Hypertension Elevated today because [place] [address]. No changes today.Marland Kitchen    Overweight (BMI 25.0-29.9) I have addressed  BMI and recommended wt loss of 10% of body weigh over the next 6 months using a low glycemic index diet and regular exercise a minimum of 5 days per week.   A total of 25 minutes of face to face time was spent with patient more than half of which was spent in counselling and coordination of care    Updated Medication List Outpatient Encounter Prescriptions as of 09/18/2013  Medication Sig Dispense Refill  . amLODipine (NORVASC) 5 MG tablet TAKE 1 TABLET BY  MOUTH EVERY DAY  90 tablet  3  . calcium carbonate (OS-CAL) 600 MG TABS Take 600 mg by mouth daily.      . cholecalciferol (VITAMIN D) 1000 UNITS tablet Take 1,000 Units by mouth daily.      . Coenzyme Q10 (COQ-10) 200 MG CAPS Take 1 capsule by mouth daily.      . Multiple Vitamin (MULTIVITAMIN) tablet Take 1 tablet by mouth daily.      . Naproxen Sodium 220 MG CAPS Take 2 capsules by mouth daily.      . Red Yeast Rice 600 MG CAPS Take 1 capsule (600 mg total) by mouth 2 (two) times daily.  180 capsule  0  . valsartan-hydrochlorothiazide (DIOVAN-HCT) 320-25 MG per tablet Take 1 tablet by mouth daily.  30 tablet  6  . venlafaxine XR (EFFEXOR-XR) 75 MG 24 hr capsule TAKE ONE CAPSULE BY MOUTH EVERY DAY  90 capsule  0  . atorvastatin (LIPITOR) 20 MG tablet Take 1 tablet (20 mg total) by mouth daily.  90 tablet  3  . zoster vaccine live, PF, (ZOSTAVAX) 16109 UNT/0.65ML injection Inject 19,400 Units into the skin once.  1 each  0   No facility-administered encounter medications on file as of 09/18/2013.     No orders of the defined types were placed in this encounter.    No Follow-up on file.

## 2013-09-18 NOTE — Assessment & Plan Note (Signed)
I have addressed  BMI and recommended wt loss of 10% of body weigh over the next 6 months using a low glycemic index diet and regular exercise a minimum of 5 days per week.   

## 2013-09-18 NOTE — Assessment & Plan Note (Signed)
diagnosed with fasting glucose of 126.

## 2013-09-18 NOTE — Patient Instructions (Addendum)
You do need a pneumonia and a shingles vaccine   You do have diabetes technically because your fasting glucose is > 125   Zoster rx provided,  Not necessary for pneumonia  atorvastatin 20 mg daily for your  LDL.  Repeat labs in 3 months fasting along with urine   Try almond milk as a substitute for milk,  Low glycemic index giet to prevent need for diabetes medications

## 2013-09-18 NOTE — Assessment & Plan Note (Addendum)
Statin therapy advised.  Trial of lipitor .  Return in 3 months
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
## 2013-09-19 NOTE — Telephone Encounter (Signed)
Discussed at office visit 09/18/13

## 2013-10-06 ENCOUNTER — Other Ambulatory Visit: Payer: Self-pay | Admitting: Internal Medicine

## 2013-10-18 ENCOUNTER — Encounter: Payer: Self-pay | Admitting: Emergency Medicine

## 2013-11-03 ENCOUNTER — Other Ambulatory Visit: Payer: Self-pay | Admitting: Internal Medicine

## 2013-12-01 ENCOUNTER — Encounter: Payer: Self-pay | Admitting: Internal Medicine

## 2013-12-01 ENCOUNTER — Ambulatory Visit (INDEPENDENT_AMBULATORY_CARE_PROVIDER_SITE_OTHER): Payer: BC Managed Care – PPO | Admitting: Internal Medicine

## 2013-12-01 VITALS — BP 124/78 | HR 75 | Temp 97.9°F | Wt 183.0 lb

## 2013-12-01 DIAGNOSIS — J019 Acute sinusitis, unspecified: Secondary | ICD-10-CM

## 2013-12-01 DIAGNOSIS — B9689 Other specified bacterial agents as the cause of diseases classified elsewhere: Secondary | ICD-10-CM

## 2013-12-01 MED ORDER — AMOXICILLIN-POT CLAVULANATE 875-125 MG PO TABS: 1.0000 | ORAL_TABLET | Freq: Two times a day (BID) | ORAL | Status: DC

## 2013-12-01 NOTE — Patient Instructions (Signed)

## 2013-12-01 NOTE — Progress Notes (Signed)
Pre-visit discussion using our clinic review tool. No additional management support is needed unless otherwise documented below in the visit note.  

## 2013-12-01 NOTE — Progress Notes (Signed)
HPI  Pt presents to the clinic today with c/o cough, chest congestion and facial pain/pressures. She reports this started 3 weeks ago. The cough is productive of thick green mucous. She also has associated ear pain, headache and teeth pain. She denies fever, but has had fatigue and body aches. She has tried Mucinex, Aleve without much relief. She has had sick contacts. She quit smoking 4 weeks ago.  Review of Systems      Past Medical History  Diagnosis Date  . Early menopause     at [age]   . Depression     managed with effexor, stable dose 8 yrs     Family History  Problem Relation Age of Onset  . Arthritis Mother   . Hypertension Mother   . Mental illness Mother     depression  . Arthritis Father   . Hypertension Father     History   Social History  . Marital Status: Married    Spouse Name: Maisie Fushomas    Number of Children: 2  . Years of Education: N/A   Occupational History  . librarian    Social History Main Topics  . Smoking status: Current Some Day Smoker -- 0.05 packs/day    Types: Cigarettes  . Smokeless tobacco: Not on file  . Alcohol Use: No  . Drug Use: Not on file  . Sexual Activity: Not on file   Other Topics Concern  . Not on file   Social History Narrative  . No narrative on file    No Known Allergies   Constitutional: Positive headache, fatigue. Denies fever or abrupt weight changes.  HEENT:  Positive sore throat. Denies eye redness, eye pain, pressure behind the eyes, facial pain, nasal congestion, ear pain,ildup, runny nose or bloody nose. Respiratory: Positive cough. Denies difficulty breathing or shortness of breath.  Cardiovascular: Denies chest pain, chest tightness, palpitations or swelling in the hands or feet.   No other specific complaints in a complete review of systems (except as listed in HPI above).  Objective:   BP 124/78  Pulse 75  Temp(Src) 97.9 F (36.6 C) (Oral)  Wt 183 lb (83.008 kg)  SpO2 98% Wt Readings from Last 3  Encounters:  12/01/13 183 lb (83.008 kg)  09/18/13 179 lb 12 oz (81.534 kg)  06/07/13 179 lb 12 oz (81.534 kg)     General: Appears her stated age, well developed, well nourished in NAD. HEENT: Head: normal shape and size, bilateral maxillary sinus tendernss; Eyes: sclera white, no icterus, conjunctiva pink, PERRLA and EOMs intact; Ears: Tm's gray and intact, normal light reflex; Nose: mucosa boggy and moist, septum midline; Throat/Mouth: + PND. Teeth present, mucosa erythematous and moist, no exudate noted, no lesions or ulcerations noted.  Neck: Mild cervical lymphadenopathy. Neck supple, trachea midline. No massses, lumps or thyromegaly present.  Cardiovascular: Normal rate and rhythm. S1,S2 noted.  No murmur, rubs or gallops noted. No JVD or BLE edema. No carotid bruits noted. Pulmonary/Chest: Normal effort and positive vesicular breath sounds. No respiratory distress. No wheezes, rales or ronchi noted.      Assessment & Plan:   Acute Sinusitis  Get some rest and drink plenty of water Do salt water gargles for the sore throat Give duration of symptoms which seem to be getting worse, eRx for Augmentin BID x 10 days  RTC as needed or if symptoms persist.

## 2013-12-19 ENCOUNTER — Encounter: Payer: Self-pay | Admitting: Internal Medicine

## 2013-12-19 ENCOUNTER — Ambulatory Visit (INDEPENDENT_AMBULATORY_CARE_PROVIDER_SITE_OTHER): Payer: BC Managed Care – PPO | Admitting: Internal Medicine

## 2013-12-19 VITALS — BP 118/78 | HR 78 | Temp 98.0°F | Wt 182.2 lb

## 2013-12-19 DIAGNOSIS — R05 Cough: Secondary | ICD-10-CM

## 2013-12-19 DIAGNOSIS — R059 Cough, unspecified: Secondary | ICD-10-CM

## 2013-12-19 DIAGNOSIS — J209 Acute bronchitis, unspecified: Secondary | ICD-10-CM

## 2013-12-19 MED ORDER — AZITHROMYCIN 250 MG PO TABS: ORAL_TABLET | ORAL | Status: DC

## 2013-12-19 MED ORDER — HYDROCODONE-HOMATROPINE 5-1.5 MG/5ML PO SYRP: 5.0000 mL | ORAL_SOLUTION | Freq: Three times a day (TID) | ORAL | Status: DC | PRN

## 2013-12-19 NOTE — Progress Notes (Signed)
HPI  Pt presents to the clinic today to follow up sinusitis. She was seen 12/01/13 and diagnosed with acute sinusitis. She was given Augmentin at that time. She reports that she is feeling worse. She now has chest congestion and a productive cough of thick yellow mucous. She reports a heavy feeling in her chest, especially when she coughs. She denies fever, chills or body aches. She has no history of allergies or asthma.   Review of Systems      Past Medical History  Diagnosis Date  . Early menopause     at [age]   . Depression     managed with effexor, stable dose 8 yrs     Family History  Problem Relation Age of Onset  . Arthritis Mother   . Hypertension Mother   . Mental illness Mother     depression  . Arthritis Father   . Hypertension Father     History   Social History  . Marital Status: Married    Spouse Name: Maisie Fushomas    Number of Children: 2  . Years of Education: N/A   Occupational History  . librarian    Social History Main Topics  . Smoking status: Current Some Day Smoker -- 0.05 packs/day    Types: Cigarettes  . Smokeless tobacco: Not on file  . Alcohol Use: No  . Drug Use: Not on file  . Sexual Activity: Not on file   Other Topics Concern  . Not on file   Social History Narrative  . No narrative on file    No Known Allergies   Constitutional: Positive fatigue. Denies headache, fever or abrupt weight changes.  HEENT:  Positive sore throat, ear fullness. Denies eye redness, eye pain, pressure behind the eyes, facial pain, nasal congestion, ear pain, ringing in the ears, wax buildup, runny nose or bloody nose. Respiratory: Positive cough. Denies difficulty breathing or shortness of breath.  Cardiovascular: Denies chest pain, chest tightness, palpitations or swelling in the hands or feet.   No other specific complaints in a complete review of systems (except as listed in HPI above).  Objective:   BP 118/78  Pulse 78  Temp(Src) 98 F (36.7 C)  Wt  182 lb 4 oz (82.668 kg)  SpO2 98% Wt Readings from Last 3 Encounters:  12/19/13 182 lb 4 oz (82.668 kg)  12/01/13 183 lb (83.008 kg)  09/18/13 179 lb 12 oz (81.534 kg)     General: Appears her stated age, well developed, well nourished in NAD. HEENT: Head: normal shape and size; Eyes: sclera white, no icterus, conjunctiva pink, PERRLA and EOMs intact; Ears: Tm's gray and intact, normal light reflex; Nose: mucosa boggy and moist, septum midline; Throat/Mouth: + PND. Teeth present, mucosa erythematous and moist, no exudate noted, no lesions or ulcerations noted.  Neck: Neck supple, trachea midline. No massses, lumps or thyromegaly present.  Cardiovascular: Normal rate and rhythm. S1,S2 noted.  No murmur, rubs or gallops noted. No JVD or BLE edema. No carotid bruits noted. Pulmonary/Chest: Normal effort and scattered rhonchi in the RUL, RML. No respiratory distress. No wheezes, rales or ronchi noted.      Assessment & Plan:   Acute Bronchitis  Get some rest and drink plenty of water Do salt water gargles for the sore throat Given worsening symptoms, will give eRx for Azithromax x 5 days Rx for Hycodan cough syrup Try an OTC zyrtec for the ear fullness  RTC as needed or if symptoms persist.

## 2013-12-19 NOTE — Patient Instructions (Signed)
Acute Bronchitis Bronchitis is inflammation of the airways that extend from the windpipe into the lungs (bronchi). The inflammation often causes mucus to develop. This leads to a cough, which is the most common symptom of bronchitis.  In acute bronchitis, the condition usually develops suddenly and goes away over time, usually in a couple weeks. Smoking, allergies, and asthma can make bronchitis worse. Repeated episodes of bronchitis may cause further lung problems.  CAUSES Acute bronchitis is most often caused by the same virus that causes a cold. The virus can spread from person to person (contagious).  SIGNS AND SYMPTOMS   Cough.   Fever.   Coughing up mucus.   Body aches.   Chest congestion.   Chills.   Shortness of breath.   Sore throat.  DIAGNOSIS  Acute bronchitis is usually diagnosed through a physical exam. Tests, such as chest X-rays, are sometimes done to rule out other conditions.  TREATMENT  Acute bronchitis usually goes away in a couple weeks. Often times, no medical treatment is necessary. Medicines are sometimes given for relief of fever or cough. Antibiotics are usually not needed but may be prescribed in certain situations. In some cases, an inhaler may be recommended to help reduce shortness of breath and control the cough. A cool mist vaporizer may also be used to help thin bronchial secretions and make it easier to clear the chest.  HOME CARE INSTRUCTIONS  Get plenty of rest.   Drink enough fluids to keep your urine clear or pale yellow (unless you have a medical condition that requires fluid restriction). Increasing fluids may help thin your secretions and will prevent dehydration.   Only take over-the-counter or prescription medicines as directed by your health care provider.   Avoid smoking and secondhand smoke. Exposure to cigarette smoke or irritating chemicals will make bronchitis worse. If you are a smoker, consider using nicotine gum or skin  patches to help control withdrawal symptoms. Quitting smoking will help your lungs heal faster.   Reduce the chances of another bout of acute bronchitis by washing your hands frequently, avoiding people with cold symptoms, and trying not to touch your hands to your mouth, nose, or eyes.   Follow up with your health care provider as directed.  SEEK MEDICAL CARE IF: Your symptoms do not improve after 1 week of treatment.  SEEK IMMEDIATE MEDICAL CARE IF:  You develop an increased fever or chills.   You have chest pain.   You have severe shortness of breath.  You have bloody sputum.   You develop dehydration.  You develop fainting.  You develop repeated vomiting.  You develop a severe headache. MAKE SURE YOU:   Understand these instructions.  Will watch your condition.  Will get help right away if you are not doing well or get worse. Document Released: 12/17/2004 Document Revised: 07/12/2013 Document Reviewed: 05/02/2013 [employer] Patient Information 2014 [employer].  

## 2013-12-19 NOTE — Progress Notes (Signed)
Pre-visit discussion using our clinic review tool. No additional management support is needed unless otherwise documented below in the visit note.  

## 2013-12-22 ENCOUNTER — Telehealth: Payer: Self-pay | Admitting: Internal Medicine

## 2013-12-22 ENCOUNTER — Other Ambulatory Visit: Payer: BC Managed Care – PPO

## 2013-12-22 NOTE — Telephone Encounter (Signed)
Relevant patient education assigned to patient using Emmi. ° °

## 2013-12-27 ENCOUNTER — Ambulatory Visit: Payer: BC Managed Care – PPO | Admitting: Internal Medicine

## 2014-01-29 ENCOUNTER — Other Ambulatory Visit: Payer: Self-pay | Admitting: Internal Medicine

## 2014-04-01 ENCOUNTER — Other Ambulatory Visit: Payer: Self-pay | Admitting: Internal Medicine

## 2014-04-02 ENCOUNTER — Other Ambulatory Visit: Payer: Self-pay | Admitting: Internal Medicine

## 2014-04-27 ENCOUNTER — Other Ambulatory Visit: Payer: Self-pay | Admitting: Internal Medicine

## 2014-04-27 NOTE — Telephone Encounter (Signed)
Appt 05/11/14 

## 2014-05-08 ENCOUNTER — Telehealth: Payer: Self-pay | Admitting: *Deleted

## 2014-05-08 DIAGNOSIS — E119 Type 2 diabetes mellitus without complications: Secondary | ICD-10-CM

## 2014-05-08 DIAGNOSIS — E785 Hyperlipidemia, unspecified: Secondary | ICD-10-CM

## 2014-05-08 NOTE — Telephone Encounter (Signed)
Pt is coming in tomorrow what labs and dx?  

## 2014-05-09 ENCOUNTER — Other Ambulatory Visit (INDEPENDENT_AMBULATORY_CARE_PROVIDER_SITE_OTHER): Payer: BC Managed Care – PPO

## 2014-05-09 DIAGNOSIS — E119 Type 2 diabetes mellitus without complications: Secondary | ICD-10-CM

## 2014-05-09 DIAGNOSIS — E785 Hyperlipidemia, unspecified: Secondary | ICD-10-CM

## 2014-05-09 LAB — COMPREHENSIVE METABOLIC PANEL
ALBUMIN: 4.3 g/dL (ref 3.5–5.2)
ALT: 42 U/L — ABNORMAL HIGH (ref 0–35)
AST: 29 U/L (ref 0–37)
Alkaline Phosphatase: 73 U/L (ref 39–117)
BILIRUBIN TOTAL: 0.5 mg/dL (ref 0.2–1.2)
BUN: 20 mg/dL (ref 6–23)
CO2: 29 mEq/L (ref 19–32)
Calcium: 9.4 mg/dL (ref 8.4–10.5)
Chloride: 104 mEq/L (ref 96–112)
Creatinine, Ser: 0.7 mg/dL (ref 0.4–1.2)
GFR: 86.58 mL/min (ref >60.00)
GLUCOSE: 139 mg/dL — AB (ref 70–99)
Potassium: 4.2 mEq/L (ref 3.5–5.1)
SODIUM: 139 meq/L (ref 135–145)
TOTAL PROTEIN: 7.3 g/dL (ref 6.0–8.3)

## 2014-05-09 LAB — MICROALBUMIN / CREATININE URINE RATIO
CREATININE, U: 139.8 mg/dL
Microalb Creat Ratio: 0.6 mg/g (ref 0.0–30.0)
Microalb, Ur: 0.8 mg/dL (ref 0.0–1.9)

## 2014-05-09 LAB — LIPID PANEL
CHOLESTEROL: 173 mg/dL (ref 0–200)
HDL: 45.1 mg/dL (ref >39.00)
LDL Cholesterol: 98 mg/dL (ref 0–99)
NonHDL: 127.9
Total CHOL/HDL Ratio: 4
Triglycerides: 150 mg/dL — ABNORMAL HIGH (ref 0.0–149.0)
VLDL: 30 mg/dL (ref 0.0–40.0)

## 2014-05-09 LAB — HEMOGLOBIN A1C: Hgb A1c MFr Bld: 6.5 % (ref 4.6–6.5)

## 2014-05-10 ENCOUNTER — Encounter: Payer: Self-pay | Admitting: Internal Medicine

## 2014-05-11 ENCOUNTER — Ambulatory Visit: Payer: BC Managed Care – PPO | Admitting: Internal Medicine

## 2014-05-16 NOTE — Telephone Encounter (Signed)
Mailed unread message to pt  

## 2014-05-30 ENCOUNTER — Ambulatory Visit (INDEPENDENT_AMBULATORY_CARE_PROVIDER_SITE_OTHER): Payer: BC Managed Care – PPO | Admitting: Internal Medicine

## 2014-05-31 ENCOUNTER — Other Ambulatory Visit: Payer: Self-pay | Admitting: Internal Medicine

## 2014-05-31 MED ORDER — VALSARTAN-HYDROCHLOROTHIAZIDE 320-25 MG PO TABS: ORAL_TABLET | ORAL | Status: DC

## 2014-05-31 MED ORDER — VENLAFAXINE HCL ER 75 MG PO CP24: ORAL_CAPSULE | ORAL | Status: DC

## 2014-05-31 MED ORDER — AMLODIPINE BESYLATE 5 MG PO TABS: ORAL_TABLET | ORAL | Status: DC

## 2014-05-31 MED ORDER — ATORVASTATIN CALCIUM 20 MG PO TABS: 20.0000 mg | ORAL_TABLET | Freq: Every day | ORAL | Status: DC

## 2014-05-31 NOTE — Telephone Encounter (Signed)
Dr. Darrick Huntsmanullo gave verbal order to refill due to patient could not stay for appointment and had to reschedule.

## 2014-05-31 NOTE — Telephone Encounter (Signed)
Please advise this is the patient that had to reschedule I sent a 30 day supply as instructed. Patient next appointment is 06/29/14

## 2014-05-31 NOTE — Telephone Encounter (Signed)
Patient had labs so 90 days refill done

## 2014-06-17 ENCOUNTER — Other Ambulatory Visit: Payer: Self-pay | Admitting: Internal Medicine

## 2014-06-29 ENCOUNTER — Ambulatory Visit: Payer: BC Managed Care – PPO | Admitting: Internal Medicine

## 2014-06-30 ENCOUNTER — Other Ambulatory Visit: Payer: Self-pay | Admitting: Internal Medicine

## 2014-07-02 NOTE — Telephone Encounter (Signed)
Last refill 7.9.15.Last OV 1.27.15 for acute w/5 cancellations.  Next OV 8.24.15.  Please advise refills

## 2014-07-02 NOTE — Telephone Encounter (Signed)
Refill one 30 days only.  Has not been seen in over 6 months so needs a non acute office visit  To continue refills

## 2014-07-16 ENCOUNTER — Encounter: Payer: Self-pay | Admitting: Internal Medicine

## 2014-07-16 ENCOUNTER — Ambulatory Visit (INDEPENDENT_AMBULATORY_CARE_PROVIDER_SITE_OTHER): Payer: BC Managed Care – PPO | Admitting: Internal Medicine

## 2014-07-16 VITALS — BP 124/86 | HR 84 | Temp 97.9°F | Resp 16 | Ht 67.0 in | Wt 182.5 lb

## 2014-07-16 DIAGNOSIS — I10 Essential (primary) hypertension: Secondary | ICD-10-CM

## 2014-07-16 DIAGNOSIS — Z720 Tobacco use: Secondary | ICD-10-CM

## 2014-07-16 DIAGNOSIS — E663 Overweight: Secondary | ICD-10-CM

## 2014-07-16 DIAGNOSIS — F17200 Nicotine dependence, unspecified, uncomplicated: Secondary | ICD-10-CM

## 2014-07-16 DIAGNOSIS — Z23 Encounter for immunization: Secondary | ICD-10-CM

## 2014-07-16 DIAGNOSIS — E119 Type 2 diabetes mellitus without complications: Secondary | ICD-10-CM

## 2014-07-16 DIAGNOSIS — R197 Diarrhea, unspecified: Secondary | ICD-10-CM

## 2014-07-16 NOTE — Patient Instructions (Addendum)
I recommend getting the majority of your calcium and Vitamin D  through diet rather than supplements given the recent association of calcium supplements with increased coronary artery calcium scores (You need 1200 mg daily )   Unsweetened almond/coconut milk is a great low calorie low carb, cholesterol free  way to increase your dietary calcium and vitamin D.  Try the blue Colin Benton  Your diabetes and cholesterol are well controlled  Keep drinking the keffir  Unless you want to test your theory   Return for annual exam 3 months   Make sur eyo have had your eye exam !!!!!

## 2014-07-16 NOTE — Progress Notes (Signed)
Patient ID: Caitlin Herring, female   DOB: 06-Sep-1951, 63 y.o.   MRN: [id number]  Patient Active Problem List   Diagnosis Date Noted  . Frequent loose stools 07/17/2014  . Tobacco abuse 07/16/2014  . Overweight (BMI 25.0-29.9) 06/09/2013  . Other and unspecified hyperlipidemia 01/31/2013  . Type II or unspecified type diabetes mellitus without mention of complication, not stated as uncontrolled 01/31/2013  . Routine general medical examination at a health care facility 01/31/2013  . Hypertension 07/05/2012  . Hoarse voice quality 07/05/2012  . Hip pain, right 07/05/2012  . Early menopause   . Depression     Subjective:  CC:   Chief Complaint  Patient presents with  . Follow-up    Lab work     HPI:   Caitlin Herring is a 63 y.o. female who presents for Follow up on multiple chronic issues.  She was last seen Oct 2014.   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  Patient ahd additional assistance of son who left college to help care for Dad.    Loose stools.  She notes that her stools have been soft and twice daily and resemble " a cowpie":  Has been occurring l summer long, no fevers,  cramping or unintentional  wt loss,  And resolved for two weeks  while on vacation .  Has been taking a form of probiotic that she stopped while away on vacation.  Has not tried suspending it at home.  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   Not checking blood sugars. Diet is low glycemic most of the time.      Past Medical History  Diagnosis Date  . Early menopause     at 75   .  Depression     managed with effexor, stable dose 8 yrs     Past Surgical History  Procedure Laterality Date  . Nasal sinus surgery  APRIL 2013    [facility]  . Ovarian cyst surgery  1977    benign       The following portions of the patient's history were reviewed and updated as appropriate: Allergies, current medications, and problem list.    Review of Systems:   Patient denies headache, fevers, malaise, unintentional weight loss, skin rash, eye pain, sinus congestion and sinus pain, sore throat, dysphagia,  hemoptysis , cough, dyspnea, wheezing, chest pain, palpitations, orthopnea, edema, abdominal pain, nausea, melena, diarrhea, constipation, flank pain, dysuria, hematuria, urinary  Frequency, nocturia, numbness, tingling, seizures,  Focal weakness, Loss of consciousness,  Tremor, insomnia, depression, anxiety, and suicidal ideation.     History   Social History  . Marital Status: Married    Spouse Name: [name]    Number of Children: 2  . Years of Education: N/A   Occupational History  . librarian    Social History Main Topics  . Smoking status: Current Some Day Smoker -- 0.05 packs/day    Types: Cigarettes  . Smokeless tobacco: Not on file  . Alcohol Use: No  . Drug Use:  Not on file  . Sexual Activity: Not on file   Other Topics Concern  . Not on file   Social History Narrative  . No narrative on file    Objective:  Filed Vitals:   07/16/14 1712  BP: 124/86  Pulse: 84  Temp: 97.9 F (36.6 C)  Resp: 16     General appearance: alert, cooperative and appears stated age Ears: normal TM's and external ear canals both ears Throat: lips, mucosa, and tongue normal; teeth and gums normal Neck: no adenopathy, no carotid bruit, supple, symmetrical, trachea midline and thyroid not enlarged, symmetric, no tenderness/mass/nodules Back: symmetric, no curvature. ROM normal. No CVA tenderness. Lungs: clear to auscultation bilaterally Heart: regular rate and  rhythm, S1, S2 normal, no murmur, click, rub or gallop Abdomen: soft, non-tender; bowel sounds normal; no masses,  no organomegaly Pulses: 2+ and symmetric Skin: Skin color, texture, turgor normal. No rashes or lesions Lymph nodes: Cervical, supraclavicular, and axillary nodes normal. Foot exam:  Nails are well trimmed,  No callouses,  Sensation intact to microfilament   Assessment and Plan:  Tobacco abuse Quit for a long time,  Had a few since the school started.   Hypertension Diastolic is elevated today but home bos have been < 130/80,  No changes today.  Lab Results  Component Value Date   CREATININE 0.7 05/09/2014   Lab Results  Component Value Date   NA 139 05/09/2014   K 4.2 05/09/2014   CL 104 05/09/2014   CO2 29 05/09/2014     Type II or unspecified type diabetes mellitus without mention of complication, not stated as uncontrolled diagnosed with fasting glucose of 126.  Historically well-controlled on diet alone .  hemoglobin A1c has been consistently at or  less than 7.0 . Patient is up-to-date on eye exams and foot exam is normal today. Patient is due for urine microalbumin to creatinine ratio at next visit. Patient is tolerating statin therapy for CAD risk reduction and on ACE/ARB for reduction in proteinuria.  Current labs are pending   Lab Results  Component Value Date   HGBA1C 6.5 05/09/2014   Lab Results  Component Value Date   MICROALBUR 0.8 05/09/2014   Lab Results  Component Value Date   CHOL 173 05/09/2014   HDL 45.10 05/09/2014   LDLCALC 98 05/09/2014   LDLDIRECT 222.7 09/15/2013   TRIG 150.0* 05/09/2014   CHOLHDL 4 05/09/2014       Frequent loose stools May be in intolerance to keffir milk she is drinking.  Advised to suspend it for 2 weeks.   Overweight (BMI 25.0-29.9) I have addressed  BMI and recommended a low glycemic index diet utilizing smaller more frequent meals to increase metabolism.  I have also recommended that patient start exercising  with a goal of 30 minutes of aerobic exercise a minimum of 5 days per week.   A total of 40 minutes was spent with patient more than half of which was spent in counseling patient on the above mentioned issues , reviewing and explaining recent labs and imaging studies done, and coordination of care.  Updated Medication List Outpatient Encounter Prescriptions as of 07/16/2014  Medication Sig  . amLODipine (NORVASC) 5 MG tablet TAKE 1 TABLET BY MOUTH EVERY DAY  . atorvastatin (LIPITOR) 20 MG tablet TAKE 1 TABLET BY MOUTH EVERY DAY  . calcium carbonate (OS-CAL) 600 MG TABS Take 600 mg by mouth daily.  . Multiple Vitamin (MULTIVITAMIN) tablet Take 1 tablet by mouth daily.  Marland Kitchen  Naproxen Sodium 220 MG CAPS Take 2 capsules by mouth daily.  . valsartan-hydrochlorothiazide (DIOVAN-HCT) 320-25 MG per tablet 1 TABLET BY MOUTH EVERY DAY  . venlafaxine XR (EFFEXOR-XR) 75 MG 24 hr capsule TAKE ONE CAPSULE BY MOUTH EVERY DAY  . cholecalciferol (VITAMIN D) 1000 UNITS tablet Take 1,000 Units by mouth daily.  . Coenzyme Q10 (COQ-10) 200 MG CAPS Take 1 capsule by mouth daily.  Marland Kitchen zoster vaccine live, PF, (ZOSTAVAX) 35573 UNT/0.65ML injection Inject 19,400 Units into the skin once.  . [DISCONTINUED] amLODipine (NORVASC) 5 MG tablet TAKE 1 TABLET BY MOUTH EVERY DAY  . [DISCONTINUED] azithromycin (ZITHROMAX) 250 MG tablet Take 2 tabs today, then 1 tab daily x 4 days  . [DISCONTINUED] HYDROcodone-homatropine (HYCODAN) 5-1.5 MG/5ML syrup Take 5 mLs by mouth every 8 (eight) hours as needed for cough.  . [DISCONTINUED] Red Yeast Rice 600 MG CAPS Take 1 capsule (600 mg total) by mouth 2 (two) times daily.  . [DISCONTINUED] valsartan-hydrochlorothiazide (DIOVAN-HCT) 320-25 MG per tablet TAKE 1 TABLET BY MOUTH EVERY DAY     Orders Placed This Encounter  Procedures  . Pneumococcal conjugate vaccine 13-valent    Return in about 3 months (around 10/16/2014).

## 2014-07-16 NOTE — Progress Notes (Signed)
Pre-visit discussion using our clinic review tool. No additional management support is needed unless otherwise documented below in the visit note.  

## 2014-07-16 NOTE — Assessment & Plan Note (Signed)
Quit for a long time,  Had a few since the school started.

## 2014-07-17 DIAGNOSIS — R197 Diarrhea, unspecified: Secondary | ICD-10-CM | POA: Insufficient documentation

## 2014-07-17 NOTE — Assessment & Plan Note (Signed)
diagnosed with fasting glucose of 126.  Historically well-controlled on diet alone .  hemoglobin A1c has been consistently at or  less than 7.0 . Patient is up-to-date on eye exams and foot exam is normal today. Patient is due for urine microalbumin to creatinine ratio at next visit. Patient is tolerating statin therapy for CAD risk reduction and on ACE/ARB for reduction in proteinuria.  Current labs are pending   Lab Results  Component Value Date   HGBA1C 6.5 05/09/2014   Lab Results  Component Value Date   MICROALBUR 0.8 05/09/2014   Lab Results  Component Value Date   CHOL 173 05/09/2014   HDL 45.10 05/09/2014   LDLCALC 98 05/09/2014   LDLDIRECT 222.7 09/15/2013   TRIG 150.0* 05/09/2014   CHOLHDL 4 05/09/2014

## 2014-07-17 NOTE — Assessment & Plan Note (Signed)
May be in intolerance to keffir milk she is drinking.  Advised to suspend it for 2 weeks.

## 2014-07-17 NOTE — Assessment & Plan Note (Signed)
Diastolic is elevated today but home bos have been < 130/80,  No changes today.  Lab Results  Component Value Date   CREATININE 0.7 05/09/2014   Lab Results  Component Value Date   NA 139 05/09/2014   K 4.2 05/09/2014   CL 104 05/09/2014   CO2 29 05/09/2014

## 2014-07-17 NOTE — Assessment & Plan Note (Signed)
I have addressed  BMI and recommended a low glycemic index diet utilizing smaller more frequent meals to increase metabolism.  I have also recommended that patient start exercising with a goal of 30 minutes of aerobic exercise a minimum of 5 days per week.  

## 2014-08-07 ENCOUNTER — Other Ambulatory Visit: Payer: Self-pay | Admitting: Internal Medicine

## 2014-08-07 MED ORDER — VENLAFAXINE HCL ER 75 MG PO CP24: ORAL_CAPSULE | ORAL | Status: AC

## 2014-08-07 NOTE — Telephone Encounter (Signed)
Fax from pharmacy, requesting 90 day supply Venlafaxine. Rx sent to pharmacy by escript

## 2014-08-07 NOTE — Addendum Note (Signed)
Addended by: Chandra Batch E on: 08/07/2014 11:27 AM   Modules accepted: Orders

## 2014-09-11 ENCOUNTER — Other Ambulatory Visit: Payer: Self-pay | Admitting: Internal Medicine

## 2014-10-10 ENCOUNTER — Other Ambulatory Visit: Payer: BC Managed Care – PPO

## 2014-10-15 ENCOUNTER — Ambulatory Visit: Payer: Self-pay | Admitting: Ophthalmology

## 2014-10-17 ENCOUNTER — Ambulatory Visit: Payer: BC Managed Care – PPO | Admitting: Internal Medicine

## 2014-12-10 ENCOUNTER — Ambulatory Visit: Payer: Self-pay | Admitting: Ophthalmology

## 2015-01-19 ENCOUNTER — Other Ambulatory Visit: Payer: Self-pay | Admitting: Internal Medicine

## 2015-01-21 ENCOUNTER — Other Ambulatory Visit: Payer: Self-pay | Admitting: Internal Medicine

## 2015-02-22 ENCOUNTER — Other Ambulatory Visit: Payer: Self-pay | Admitting: Internal Medicine

## 2015-03-17 NOTE — Op Note (Signed)
PATIENT NAME:  Caitlin Herring, [name] MR#:  [id number] DATE OF BIRTH:  [birth year]  DATE OF PROCEDURE:  11/26/2011  PREOPERATIVE DIAGNOSIS: Complex left nasal lacrimal duct mass.   POSTOPERATIVE DIAGNOSIS: Complex left nasal lacrimal duct mass.   PROCEDURES:  1. Endoscopic Dacryocystorhinostomy (complex). 2. Image-guided sinus surgery.  3. Left anterior and posterior ethmoidectomy with tissue removal.  4. Left maxillary antrostomy with tissue removal.  5. Left frontal sinusotomy with tissue removal.   SURGEON: Zackery BarefootJ. Madison Acelynn Dejonge, M.D.   DESCRIPTION OF PROCEDURE: The patient was placed in the supine position on the operating room table. After general endotracheal anesthesia had been induced, the patient was turned 90 degrees counterclockwise from anesthesia and the nose was anesthetized with infraorbital nerve blocks and left greater palatine nerve blocks.   The image-guided sinus surgery system mask was attached, using Stryker navigation, and registration was carried out in the standard fashion using the appropriate fiduciary points. Calibration of the system was confirmed and extensive review of the CT scan in all three dimensions preoperatively and intraoperatively was carried out.  Each instrument was registered and confirmed for anatomic accuracy.  Attention was directed to the sinus surgery portion of the surgery. The left side was addressed first. The middle turbinate was taken down subtotally with a through-cutting forceps. The uncinate process was then identified and completely resected revealing the natural maxillary sinus ostium. The natural maxillary sinus ostium was progressively enlarged to create a large maxillary antrostomy, removing diseased tissue. The maxillary antrum was then irrigated copiously with saline. Attention was directed to the ethmoid sinuses where the ethmoid sinuses were taken down from anterior to posterior preserving the skull base and the lamina papyracea, removing  diseased tissue. After the ethmoid sinuses had been completely cleaned out of polypoid chronically inflamed mucosa preserving the mucosa on the medial side of the middle turbinate along the skull base and along the lamina papyracea. The polypoid tissue extended to the frontal recess, and it was removed to open the frontal recess maximally.  After the ethmoid, frontal and maxillary sinuses had been cleared, the Silastic stents were placed through the superior and inferior punctae and brought through the lacrimal sac intranasally using endoscopic visualization. The nasal lacrimal duct bony surface was taken down with endoscopic drill. The nasal lacrimal duct was then carefully and meticulously elevated from the bony canal to include the periosteum. This was sent for frozen section analysis which showed chronic inflammation, but no neoplasm. At this point, the Silastic stents were tied and secured to the septum with a single 3-0 nylon suture. Temporary Telfa pledgets were placed and tied over the columella. The patient was returned to anesthesia, allowed to emerge from anesthesia in the Operating Room, and taken to the Recovery Room in stable condition. There were no complications.   ADDENDUM: The left eye was protected with a corneal shield during the entire procedure.  ____________________________ J. Gertie BaronMadison Asad Keeven, MD [email or web address] D: 11/26/2011 21:22:13 ET T: 11/27/2011 10:09:02 ET JOB#: [id number]  cc: Zackery BarefootJ. Madison Halleigh Comes, MD, <Dictator> Wendee CoppJMADISON Melven Stockard MD ELECTRONICALLY SIGNED 01/01/2012 7:30

## 2015-03-30 ENCOUNTER — Other Ambulatory Visit: Payer: Self-pay | Admitting: Internal Medicine

## 2015-04-05 NOTE — Telephone Encounter (Signed)
Mailed unread message to pt  

## 2015-05-03 ENCOUNTER — Other Ambulatory Visit: Payer: Self-pay | Admitting: Internal Medicine

## 2015-05-06 NOTE — Telephone Encounter (Signed)
Called and left VM on need for appt and fasting lab appt

## 2015-05-09 NOTE — Telephone Encounter (Signed)
Left message for pt on need for appt and labs, Last labs 05/09/14, last appt 07/16/14. Pt has not called back to schedule appt. Refill?

## 2015-07-19 ENCOUNTER — Ambulatory Visit
Admission: EM | Admit: 2015-07-19 | Discharge: 2015-07-19 | Disposition: A | Discharge status: Home / Self Care | Payer: Medicare PPO | Attending: Family Medicine | Admitting: Family Medicine

## 2015-07-19 DIAGNOSIS — W458XXA Other foreign body or object entering through skin, initial encounter: Secondary | ICD-10-CM | POA: Diagnosis not present

## 2015-07-19 DIAGNOSIS — S6982XA Other specified injuries of left wrist, hand and finger(s), initial encounter: Secondary | ICD-10-CM | POA: Diagnosis not present

## 2015-07-19 DIAGNOSIS — Z23 Encounter for immunization: Secondary | ICD-10-CM | POA: Diagnosis not present

## 2015-07-19 DIAGNOSIS — S6992XA Unspecified injury of left wrist, hand and finger(s), initial encounter: Secondary | ICD-10-CM

## 2015-07-19 MED ORDER — TETANUS-DIPHTHERIA TOXOIDS TD 5-2 LFU IM INJ
0.5000 mL | INJECTION | Freq: Once | INTRAMUSCULAR | Status: AC
  Administered 2015-07-19: 0.5 mL via INTRAMUSCULAR

## 2015-07-19 NOTE — Discharge Instructions (Signed)
Keep area clean. Clean daily with soap and water. Apply topical antibiotic ointment such as Neosporin.  Follow-up with your primary care physician as needed. Return to the urgent care or go to ER for redness, drainage, swelling, new or worsening concerns.  Fish Hook Injury  A fish hook can cause a small cut or lesion that extends through all layers of the skin and into the subcutaneous tissue. Because of this, bacteria may get injected beneath the surface of the skin. A simple bandage (dressing) may be applied. This should be changed daily. Follow your caregiver's instructions regarding use of any antibacterial ointments.  Only take over-the-counter or prescription medicines for pain, discomfort, or fever as directed by your caregiver. If you did not receive a tetanus shot from your caregiver because you did not recall when your last one was given, make sure to check with your physician's office and determine if one is needed. Generally for a "dirty" wound, you should receive a tetanus booster if you have not had one in the last five years. If you have a "clean" wound, you should receive a tetanus booster if you have not had one within the last ten years. SEEK IMMEDIATE MEDICAL CARE IF:   You develop redness, swelling, or increasing pain in the wound.  You have a fever.  You notice a bad smell coming from the wound or dressing.  You notice pus or other unusual drainage coming from the wound. Document Released: 11/06/2000 Document Revised: 02/01/2012 Document Reviewed: 05/30/2009 [facility] Patient Information 2015 [place], [place]. This information is not intended to replace advice given to you by your health care provider. Make sure you discuss any questions you have with your health care provider.

## 2015-07-19 NOTE — ED Provider Notes (Signed)
[facility] Emergency Department Provider Note  ____________________________________________  Time seen: Approximately 7:02 PM  I have reviewed the triage vital signs and the nursing notes.   HISTORY  Chief Complaint Hand Pain   HPI Caitlin Herring is a 64 y.o. female presents for concern for tetanus immunization. Patient reports that yesterday afternoon she and her husband were fishing. Patient states that she went to hand her fishing pole over to her husband and the hook became caught into her left second finger. Patient reports that left second distal finger the barb of the hook went into her finger. States that initially it bled and her husband was able to pull hook out of finger at site. Patient states that immediately after pulling OUT she then washed and soaked her finger. Patient denies pain. Patient states that she has full sensation and full motion in finger. Denies other injury. Patient states that she is here today as she does not recall her last tetanus immunization and wants tetanus updated.  Patient again denies pain. Denies numbness or tingling sensation. Denies other injury.States hook was removed complete. Denies chance of retained foreign body, states "there is no way anything was left in there."  Past Medical History  Diagnosis Date  . Early menopause     at 50   . Depression     managed with effexor, stable dose 8 yrs     Patient Active Problem List   Diagnosis Date Noted  . Frequent loose stools 07/17/2014  . Tobacco abuse 07/16/2014  . Overweight (BMI 25.0-29.9) 06/09/2013  . Other and unspecified hyperlipidemia 01/31/2013  . Type II or unspecified type diabetes mellitus without mention of complication, not stated as uncontrolled 01/31/2013  . Routine general medical examination at a health care facility 01/31/2013  . Hypertension 07/05/2012  . Hoarse voice quality 07/05/2012  . Hip pain, right 07/05/2012  . Early menopause   .  Depression     Past Surgical History  Procedure Laterality Date  . Nasal sinus surgery  APRIL 2013    [facility]  . Ovarian cyst surgery  1977    benign    Current Outpatient Rx  Name  Route  Sig  Dispense  Refill  . amLODipine (NORVASC) 5 MG tablet      TAKE 1 TABLET BY MOUTH EVERY DAY   90 tablet   0   . atorvastatin (LIPITOR) 20 MG tablet      TAKE 1 TABLET BY MOUTH EVERY DAY   90 tablet   0     **Patient requests 90 days supply**   . calcium carbonate (OS-CAL) 600 MG TABS   Oral   Take 600 mg by mouth daily.         . cholecalciferol (VITAMIN D) 1000 UNITS tablet   Oral   Take 1,000 Units by mouth daily.         . Coenzyme Q10 (COQ-10) 200 MG CAPS   Oral   Take 1 capsule by mouth daily.         . Multiple Vitamin (MULTIVITAMIN) tablet   Oral   Take 1 tablet by mouth daily.         . Naproxen Sodium 220 MG CAPS   Oral   Take 2 capsules by mouth daily.         . valsartan-hydrochlorothiazide (DIOVAN-HCT) 320-25 MG per tablet      TAKE 1 TABLET BY MOUTH EVERY DAY   30 tablet   0  NEEDS OFFICE VISIT   AND PRIOR TO FURTHER REFILLS, ...   . venlafaxine XR (EFFEXOR-XR) 75 MG 24 hr capsule      TAKE 1 CAPSULE BY MOUTH EVERY DAY.   90 capsule   0   . atorvastatin (LIPITOR) 20 MG tablet      TAKE 1 TABLET BY MOUTH EVERY DAY   90 tablet   0   . valsartan-hydrochlorothiazide (DIOVAN-HCT) 320-25 MG per tablet      1 TABLET BY MOUTH EVERY DAY   30 tablet   0     NEEDS OFFICE VISIT PRIOR TO FURTHER REFILLS, PLEAS ...   . zoster vaccine live, PF, (ZOSTAVAX) 69629 UNT/0.65ML injection   Subcutaneous   Inject 19,400 Units into the skin once.   1 each   0     Allergies Review of patient's allergies indicates no known allergies.  Family History  Problem Relation Age of Onset  . Arthritis Mother   . Hypertension Mother   . Mental illness Mother     depression  . Arthritis Father   . Hypertension Father     Social  History Social History  Substance Use Topics  . Smoking status: Former Smoker -- 0.05 packs/day    Types: Cigarettes    Quit date: 05/10/2015  . Smokeless tobacco: Never Used  . Alcohol Use: No    Review of Systems Constitutional: No fever/chills Eyes: No visual changes. ENT: No sore throat. Cardiovascular: Denies chest pain. Respiratory: Denies shortness of breath. Gastrointestinal: No abdominal pain.  No nausea, no vomiting.  No diarrhea.  No constipation. Genitourinary: Negative for dysuria. Musculoskeletal: Negative for back pain. Skin: Negative for rash. Injury to left 2 finger as above.   Neurological: Negative for headaches, focal weakness or numbness.  10-point ROS otherwise negative.  ____________________________________________   PHYSICAL EXAM:  VITAL SIGNS: ED Triage Vitals  Enc Vitals Group     BP 07/19/15 1833 126/74 mmHg     Pulse Rate 07/19/15 1833 82     Resp 07/19/15 1833 16     Temp 07/19/15 1833 97.8 F (36.6 C)     Temp Source 07/19/15 1833 Oral     SpO2 07/19/15 1833 99 %     Weight 07/19/15 1833 178 lb (80.74 kg)     Height 07/19/15 1833 5\' 7"  (1.702 m)     Head Cir --      Peak Flow --      Pain Score --      Pain Loc --      Pain Edu? --      Excl. in GC? --     Constitutional: Alert and oriented. Well appearing and in no acute distress. Eyes: Conjunctivae are normal. PERRL. EOMI. Head: Atraumatic.  Nose: No congestion/rhinnorhea.  Mouth/Throat: Mucous membranes are moist.  Neck: No stridor.  No cervical spine tenderness to palpation. Hematological/Lymphatic/Immunilogical: No cervical lymphadenopathy. Cardiovascular: Normal rate, regular rhythm. Grossly normal heart sounds.  Good peripheral circulation. Respiratory: Normal respiratory effort.  No retractions. Lungs CTAB. Gastrointestinal: Soft and nontender. No distention.  Musculoskeletal: No lower or upper extremity tenderness nor edema.  No joint effusions. See skin below.   Neurologic:  Normal speech and language. No gross focal neurologic deficits are appreciated. No gait instability. Skin:  Skin is warm, dry and intact. No rash noted.  Except: left second distal phalanx palmer surface healing wound, with small abrasion  Present. No erythema, no drainage, no swelling, no signs of infection.  Left distal second finger full ROM, nontender, sensation and motor intact, no tendon or motor  deficit.  Cap refill <2 secs.  Psychiatric: Mood and affect are normal. Speech and behavior are normal.  __________________________________________   INITIAL IMPRESSION / ASSESSMENT AND PLAN / ED COURSE  Pertinent labs & imaging results that were available during my care of the patient were reviewed by me and considered in my medical decision making (see chart for details).  Very well-appearing patient. No acute distress. Presents to urgent care for the complaint of requesting a tetanus immunization. Patient states yesterday afternoon her husband were fishing and a fish hook barb became lodged in her left second finger. Patient states that husband was able to pull hook out intact. Patient denies pain. Denies numbness or difficulty with movement. Left distal second finger with healing wound with superficial abrasion present. Left second finger nontender with full range of motion. No signs of infection. Patient states that she does not want x-ray or antibiotics at this time, refuses xray and antibiotics at this time. States will return if becomes red or appears infected.  Tetanus immunization updated. Follow up with primary care physician as needed. Discussed follow-up and return parameters. Patient verbalized understanding and agreed to plan. ____________________________________________   FINAL CLINICAL IMPRESSION(S) / ED DIAGNOSES  Final diagnoses:  Immunization, tetanus toxoid  Fish hook injury of finger, left, initial encounter       Renford Dills, NP 07/19/15 2102

## 2015-07-19 NOTE — ED Notes (Signed)
Pt was fishing yesterday when a fishing hook punctured her left index finger. Pt reports blood was "spurting". Husband had to remove hook with pliers. Pt states the pain was about a 6/10. Pt is concerned because her last Tdap was about 10 years ago.

## 2016-12-12 DIAGNOSIS — M1990 Unspecified osteoarthritis, unspecified site: Secondary | ICD-10-CM | POA: Insufficient documentation

## 2017-01-15 ENCOUNTER — Other Ambulatory Visit: Payer: Self-pay | Admitting: Certified Nurse Midwife

## 2017-01-15 DIAGNOSIS — Z13820 Encounter for screening for osteoporosis: Secondary | ICD-10-CM

## 2017-01-15 DIAGNOSIS — Z1239 Encounter for other screening for malignant neoplasm of breast: Secondary | ICD-10-CM

## 2017-03-08 ENCOUNTER — Other Ambulatory Visit: Payer: Medicare PPO

## 2017-04-01 ENCOUNTER — Ambulatory Visit
Admission: RE | Admit: 2017-04-01 | Discharge: 2017-04-01 | Disposition: A | Discharge status: Home / Self Care | Payer: Medicare Other | Source: Ambulatory Visit | Attending: Certified Nurse Midwife | Admitting: Certified Nurse Midwife

## 2017-04-01 DIAGNOSIS — Z13820 Encounter for screening for osteoporosis: Secondary | ICD-10-CM

## 2017-04-01 DIAGNOSIS — Z1231 Encounter for screening mammogram for malignant neoplasm of breast: Secondary | ICD-10-CM | POA: Diagnosis present

## 2017-04-01 DIAGNOSIS — Z1239 Encounter for other screening for malignant neoplasm of breast: Secondary | ICD-10-CM

## 2017-04-05 ENCOUNTER — Other Ambulatory Visit: Payer: Self-pay | Admitting: *Deleted

## 2017-04-05 ENCOUNTER — Inpatient Hospital Stay
Admission: RE | Admit: 2017-04-05 | Discharge: 2017-04-05 | Disposition: A | Discharge status: Home / Self Care | Payer: Self-pay | Source: Ambulatory Visit | Attending: *Deleted | Admitting: *Deleted

## 2017-04-05 DIAGNOSIS — Z9289 Personal history of other medical treatment: Secondary | ICD-10-CM

## 2018-06-27 ENCOUNTER — Other Ambulatory Visit: Payer: Self-pay | Admitting: Certified Nurse Midwife

## 2018-06-27 ENCOUNTER — Other Ambulatory Visit: Payer: Self-pay | Admitting: Pediatrics

## 2018-06-27 DIAGNOSIS — Z1231 Encounter for screening mammogram for malignant neoplasm of breast: Secondary | ICD-10-CM

## 2018-08-08 ENCOUNTER — Ambulatory Visit
Admission: RE | Admit: 2018-08-08 | Discharge: 2018-08-08 | Disposition: A | Discharge status: Home / Self Care | Payer: Medicare Other | Source: Ambulatory Visit | Attending: Pediatrics | Admitting: Pediatrics

## 2018-08-08 DIAGNOSIS — Z1231 Encounter for screening mammogram for malignant neoplasm of breast: Secondary | ICD-10-CM

## 2018-10-25 ENCOUNTER — Other Ambulatory Visit: Payer: Self-pay | Admitting: Physician Assistant

## 2018-10-25 DIAGNOSIS — H9201 Otalgia, right ear: Secondary | ICD-10-CM

## 2018-11-01 ENCOUNTER — Other Ambulatory Visit
Admission: RE | Admit: 2018-11-01 | Discharge: 2018-11-01 | Disposition: A | Discharge status: Home / Self Care | Payer: Medicare Other | Source: Home / Self Care | Attending: Physician Assistant | Admitting: Physician Assistant

## 2018-11-01 ENCOUNTER — Ambulatory Visit
Admission: RE | Admit: 2018-11-01 | Discharge: 2018-11-01 | Disposition: A | Discharge status: Home / Self Care | Payer: Medicare Other | Source: Ambulatory Visit | Attending: Physician Assistant | Admitting: Physician Assistant

## 2018-11-01 DIAGNOSIS — H9201 Otalgia, right ear: Secondary | ICD-10-CM | POA: Diagnosis not present

## 2018-11-01 LAB — CREATININE, SERUM
CREATININE: 0.71 mg/dL (ref 0.44–1.00)
GFR calc Af Amer: >60 mL/min (ref >60)

## 2018-11-01 MED ORDER — IOHEXOL 300 MG/ML  SOLN
75.0000 mL | Freq: Once | INTRAMUSCULAR | Status: AC | PRN
  Administered 2018-11-01: 75 mL via INTRAVENOUS

## 2019-10-02 ENCOUNTER — Other Ambulatory Visit: Payer: Self-pay | Admitting: Pediatrics

## 2019-10-02 DIAGNOSIS — Z1231 Encounter for screening mammogram for malignant neoplasm of breast: Secondary | ICD-10-CM

## 2019-11-02 ENCOUNTER — Other Ambulatory Visit: Payer: Self-pay

## 2019-11-02 DIAGNOSIS — Z20822 Contact with and (suspected) exposure to covid-19: Secondary | ICD-10-CM

## 2019-11-04 LAB — NOVEL CORONAVIRUS, NAA: SARS-CoV-2, NAA: NOT DETECTED

## 2019-11-06 ENCOUNTER — Telehealth: Payer: Self-pay | Admitting: General Practice

## 2019-11-06 NOTE — Telephone Encounter (Signed)
Gave patient negative covid test results Patient understood 

## 2019-12-17 ENCOUNTER — Ambulatory Visit: Payer: Medicare PPO | Attending: Internal Medicine

## 2019-12-17 ENCOUNTER — Ambulatory Visit: Payer: Self-pay

## 2019-12-17 ENCOUNTER — Ambulatory Visit: Payer: Medicare PPO

## 2019-12-17 DIAGNOSIS — Z23 Encounter for immunization: Secondary | ICD-10-CM | POA: Insufficient documentation

## 2019-12-17 NOTE — Progress Notes (Signed)
   Covid-19 Vaccination Clinic  Name:  Caitlin Herring    MRN: [id number] DOB: 10-Mar-1951  12/17/2019  [name] was observed post Covid-19 immunization for 15 minutes without incidence. She was provided with Vaccine Information Sheet and instruction to access the V-Safe system.   [name] was instructed to call 911 with any severe reactions post vaccine: Marland Kitchen Difficulty breathing  . Swelling of your face and throat  . A fast heartbeat  . A bad rash all over your body  . Dizziness and weakness    Immunizations Administered    Name Date Dose VIS Date Route   Pfizer COVID-19 Vaccine 12/17/2019 10:59 AM 0.3 mL 11/03/2019 Intramuscular   Manufacturer: [employer], [employer]   Lot: [id number]   NDC: [address]-3

## 2019-12-19 ENCOUNTER — Ambulatory Visit: Payer: Self-pay

## 2020-01-08 ENCOUNTER — Ambulatory Visit: Payer: Medicare PPO | Attending: Internal Medicine

## 2020-01-08 DIAGNOSIS — Z23 Encounter for immunization: Secondary | ICD-10-CM

## 2020-01-08 NOTE — Progress Notes (Signed)
   Covid-19 Vaccination Clinic  Name:  Caitlin Herring    MRN: [id number] DOB: 12-30-1950  01/08/2020  Caitlin Herring was observed post Covid-19 immunization for 15 minutes without incidence. She was provided with Vaccine Information Sheet and instruction to access the V-Safe system.   Caitlin Herring was instructed to call 911 with any severe reactions post vaccine: Marland Kitchen Difficulty breathing  . Swelling of your face and throat  . A fast heartbeat  . A bad rash all over your body  . Dizziness and weakness    Immunizations Administered    Name Date Dose VIS Date Route   Pfizer COVID-19 Vaccine 01/08/2020  8:22 AM 0.3 mL 11/03/2019 Intramuscular   Manufacturer: [employer], [employer]   Lot: [id number]   NDC: [address]-9

## 2020-01-15 ENCOUNTER — Ambulatory Visit
Admission: RE | Admit: 2020-01-15 | Discharge: 2020-01-15 | Disposition: A | Discharge status: Home / Self Care | Payer: Medicare PPO | Source: Ambulatory Visit | Attending: Pediatrics | Admitting: Pediatrics

## 2020-01-15 DIAGNOSIS — Z1231 Encounter for screening mammogram for malignant neoplasm of breast: Secondary | ICD-10-CM

## 2020-01-16 ENCOUNTER — Ambulatory Visit: Payer: Medicare Other

## 2020-02-08 ENCOUNTER — Other Ambulatory Visit: Payer: Self-pay

## 2020-02-08 ENCOUNTER — Telehealth: Payer: Self-pay

## 2020-02-08 DIAGNOSIS — Z1211 Encounter for screening for malignant neoplasm of colon: Secondary | ICD-10-CM

## 2020-02-08 MED ORDER — NA SULFATE-K SULFATE-MG SULF 17.5-3.13-1.6 GM/177ML PO SOLN: 354.0000 mL | Freq: Once | ORAL | 0 refills | Status: AC

## 2020-02-08 NOTE — Telephone Encounter (Signed)
Gastroenterology Pre-Procedure Review    PATIENT REVIEW QUESTIONS: The patient responded to the following health history questions as indicated:    1. Are you having any GI issues? no 2. Do you have a personal history of Polyps? no 3. Do you have a family history of Colon Cancer or Polyps? no 4. 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 Joint replacements in the past 12 months?no 6. Major health problems in the past 3 months?no 7. Any artificial heart valves, MVP, or defibrillator?no    MEDICATIONS & ALLERGIES:    Patient reports the following regarding taking any anticoagulation/antiplatelet therapy:   Plavix, Coumadin, Eliquis, Xarelto, Lovenox, Pradaxa, Brilinta, or Effient? no Aspirin? Aspirin 81mg    Patient confirms/reports the following medications:  Current Outpatient Medications  Medication Sig Dispense Refill  . amLODipine (NORVASC) 5 MG tablet TAKE 1 TABLET BY MOUTH EVERY DAY 90 tablet 0  . atorvastatin (LIPITOR) 20 MG tablet TAKE 1 TABLET BY MOUTH EVERY DAY 90 tablet 0  . atorvastatin (LIPITOR) 20 MG tablet TAKE 1 TABLET BY MOUTH EVERY DAY 90 tablet 0  . calcium carbonate (OS-CAL) 600 MG TABS Take 600 mg by mouth daily.    . cholecalciferol (VITAMIN D) 1000 UNITS tablet Take 1,000 Units by mouth daily.    . Coenzyme Q10 (COQ-10) 200 MG CAPS Take 1 capsule by mouth daily.    . Multiple Vitamin (MULTIVITAMIN) tablet Take 1 tablet by mouth daily.    . Naproxen Sodium 220 MG CAPS Take 2 capsules by mouth daily.    . valsartan-hydrochlorothiazide (DIOVAN-HCT) 320-25 MG per tablet 1 TABLET BY MOUTH EVERY DAY 30 tablet 0  . valsartan-hydrochlorothiazide (DIOVAN-HCT) 320-25 MG per tablet TAKE 1 TABLET BY MOUTH EVERY DAY 30 tablet 0  . venlafaxine XR (EFFEXOR-XR) 75 MG 24 hr capsule TAKE 1 CAPSULE BY MOUTH EVERY DAY. 90 capsule 0  . zoster vaccine live, PF, (ZOSTAVAX) UNT/0.65ML injection Inject 19,400 Units into the skin once. 1 each 0   No current facility-administered medications  for this visit.    Patient confirms/reports the following allergies:  No Known Allergies  No orders of the defined types were placed in this encounter.   AUTHORIZATION INFORMATION Primary Insurance: 1D#: Group #:  Secondary Insurance: 1D#: Group #:  SCHEDULE INFORMATION: Date: 02/26/2020 Time: Location: [place]

## 2020-02-26 ENCOUNTER — Ambulatory Visit: Admit: 2020-02-26 | Payer: Medicare PPO | Admitting: Gastroenterology

## 2020-02-26 SURGERY — COLONOSCOPY WITH PROPOFOL
Anesthesia: General

## 2021-03-24 ENCOUNTER — Other Ambulatory Visit: Payer: Self-pay | Admitting: Pediatrics

## 2021-03-24 DIAGNOSIS — Z1231 Encounter for screening mammogram for malignant neoplasm of breast: Secondary | ICD-10-CM

## 2021-03-27 ENCOUNTER — Other Ambulatory Visit: Payer: Self-pay

## 2021-03-27 ENCOUNTER — Ambulatory Visit
Admission: RE | Admit: 2021-03-27 | Discharge: 2021-03-27 | Disposition: A | Discharge status: Home / Self Care | Payer: Medicare PPO | Source: Ambulatory Visit | Attending: Pediatrics | Admitting: Pediatrics

## 2021-03-27 DIAGNOSIS — Z1231 Encounter for screening mammogram for malignant neoplasm of breast: Secondary | ICD-10-CM | POA: Diagnosis not present

## 2022-03-03 ENCOUNTER — Other Ambulatory Visit: Payer: Self-pay | Admitting: Pediatrics

## 2022-03-03 DIAGNOSIS — Z1231 Encounter for screening mammogram for malignant neoplasm of breast: Secondary | ICD-10-CM

## 2022-04-07 ENCOUNTER — Other Ambulatory Visit: Payer: Self-pay | Admitting: Pediatrics

## 2022-04-07 DIAGNOSIS — Z780 Asymptomatic menopausal state: Secondary | ICD-10-CM

## 2022-04-28 ENCOUNTER — Ambulatory Visit
Admission: RE | Admit: 2022-04-28 | Discharge: 2022-04-28 | Disposition: A | Discharge status: Home / Self Care | Payer: Medicare PPO | Source: Ambulatory Visit | Attending: Pediatrics | Admitting: Pediatrics

## 2022-04-28 DIAGNOSIS — Z1231 Encounter for screening mammogram for malignant neoplasm of breast: Secondary | ICD-10-CM | POA: Insufficient documentation

## 2022-05-28 ENCOUNTER — Other Ambulatory Visit: Payer: Medicare PPO

## 2023-05-12 ENCOUNTER — Other Ambulatory Visit: Payer: Self-pay | Admitting: Pediatrics

## 2023-05-12 DIAGNOSIS — Z1231 Encounter for screening mammogram for malignant neoplasm of breast: Secondary | ICD-10-CM

## 2023-05-13 ENCOUNTER — Other Ambulatory Visit: Payer: Self-pay | Admitting: Pediatrics

## 2023-05-13 DIAGNOSIS — Z780 Asymptomatic menopausal state: Secondary | ICD-10-CM

## 2023-07-14 ENCOUNTER — Ambulatory Visit
Admission: RE | Admit: 2023-07-14 | Discharge: 2023-07-14 | Disposition: A | Discharge status: Home / Self Care | Payer: Medicare PPO | Source: Ambulatory Visit | Attending: Pediatrics | Admitting: Pediatrics

## 2023-07-14 DIAGNOSIS — Z780 Asymptomatic menopausal state: Secondary | ICD-10-CM | POA: Insufficient documentation

## 2023-07-14 DIAGNOSIS — Z1231 Encounter for screening mammogram for malignant neoplasm of breast: Secondary | ICD-10-CM

## 2023-12-11 IMAGING — MG MM DIGITAL SCREENING BILAT W/ TOMO AND CAD
8 series · 8 of 24 positions shown · non-contrast
Comparison: Previous exam(s).

CLINICAL DATA: Screening.

EXAM:
DIGITAL SCREENING BILATERAL MAMMOGRAM WITH TOMOSYNTHESIS AND CAD
TECHNIQUE: Bilateral screening digital craniocaudal and mediolateral oblique
mammograms were obtained. Bilateral screening digital breast
tomosynthesis was performed. The images were evaluated with
computer-aided detection.

[L MLO synth-2D]
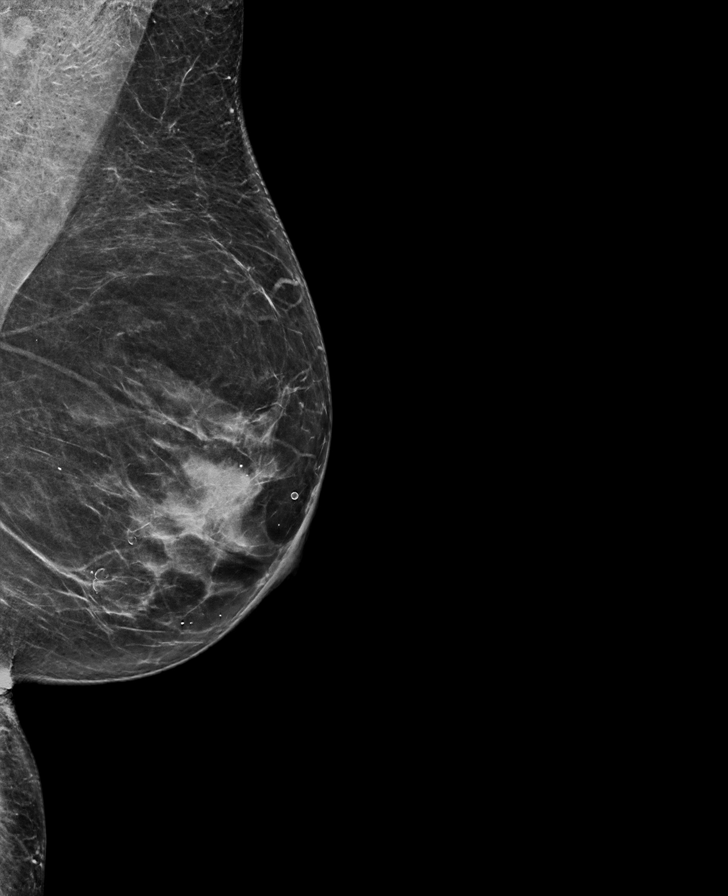

[R MLO synth-2D]
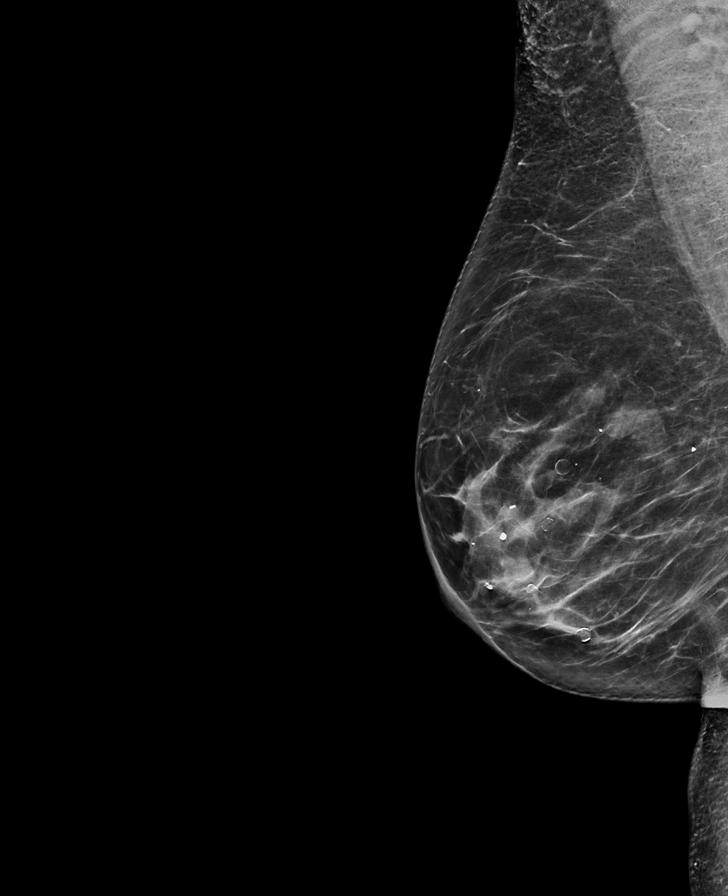

[R CC synth-2D]
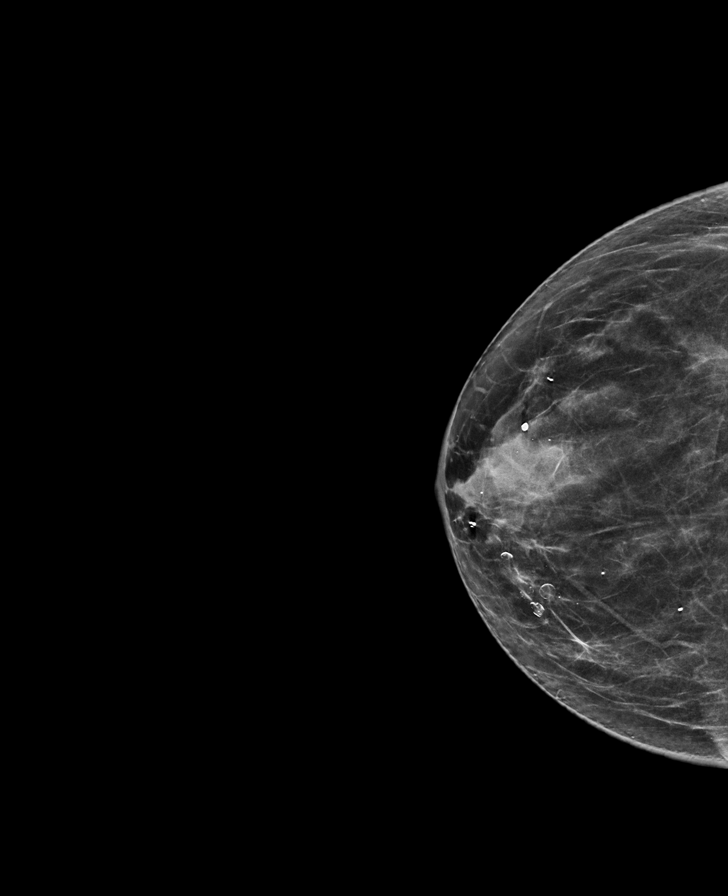

[L CC synth-2D]
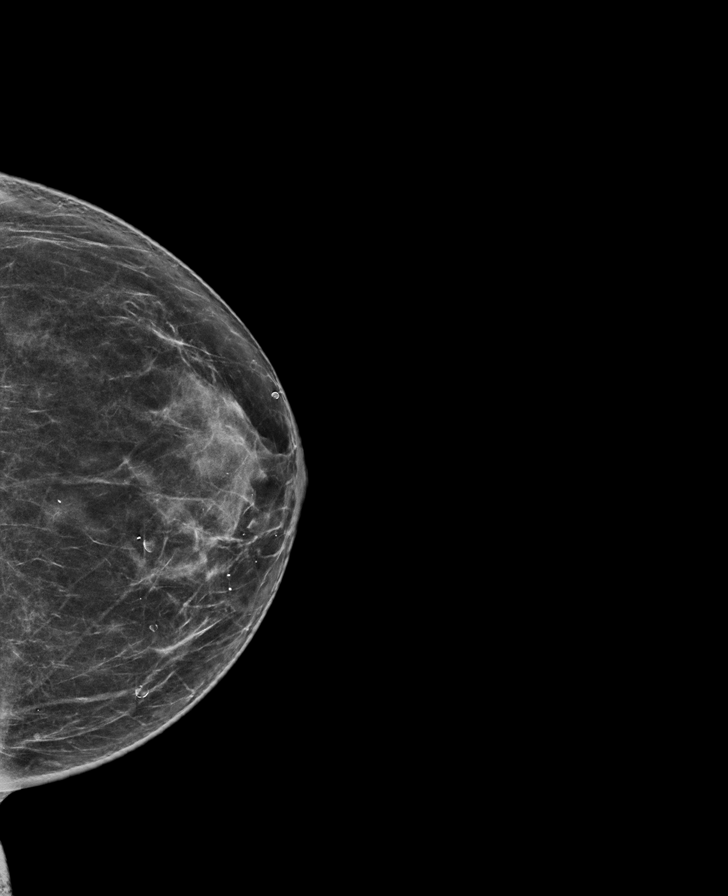

[L MLO tomo · tomo slice 37/73.0]
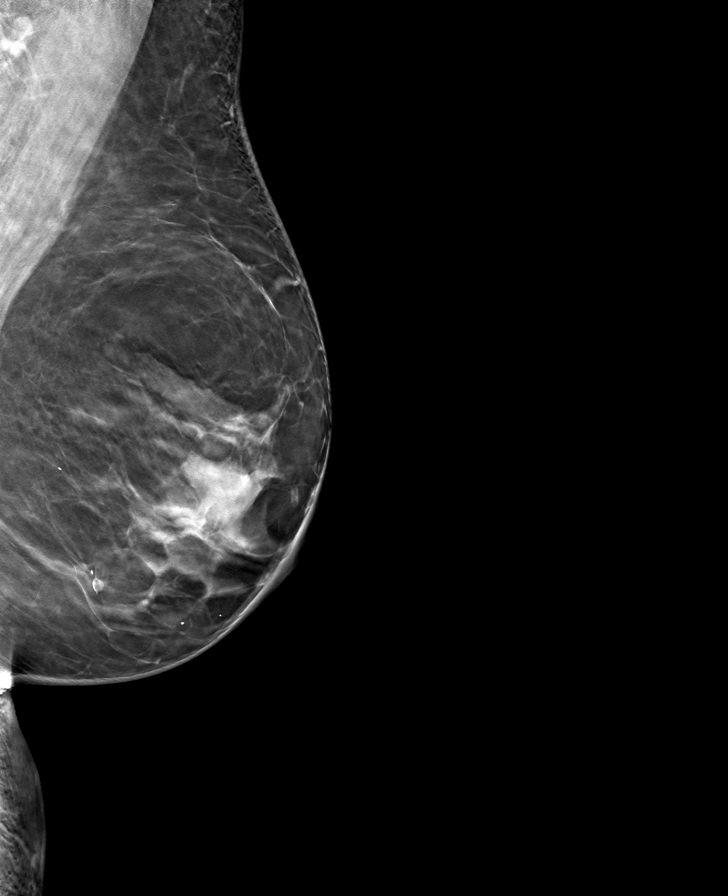

[R MLO tomo · tomo slice 37/74.0]
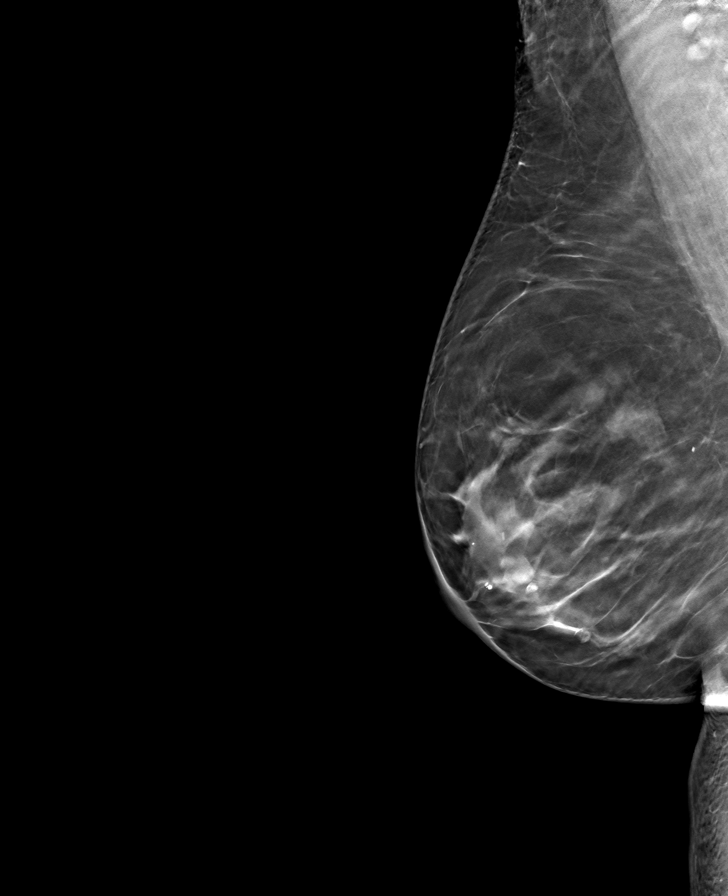

[R CC tomo · tomo slice 34/67.0]
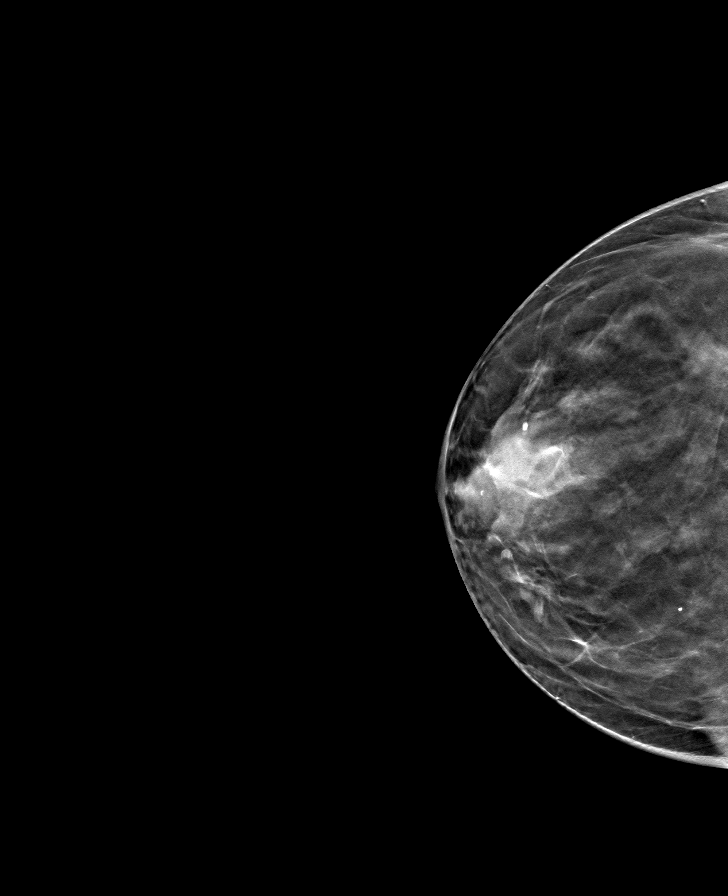

[L CC tomo · tomo slice 35/69.0]
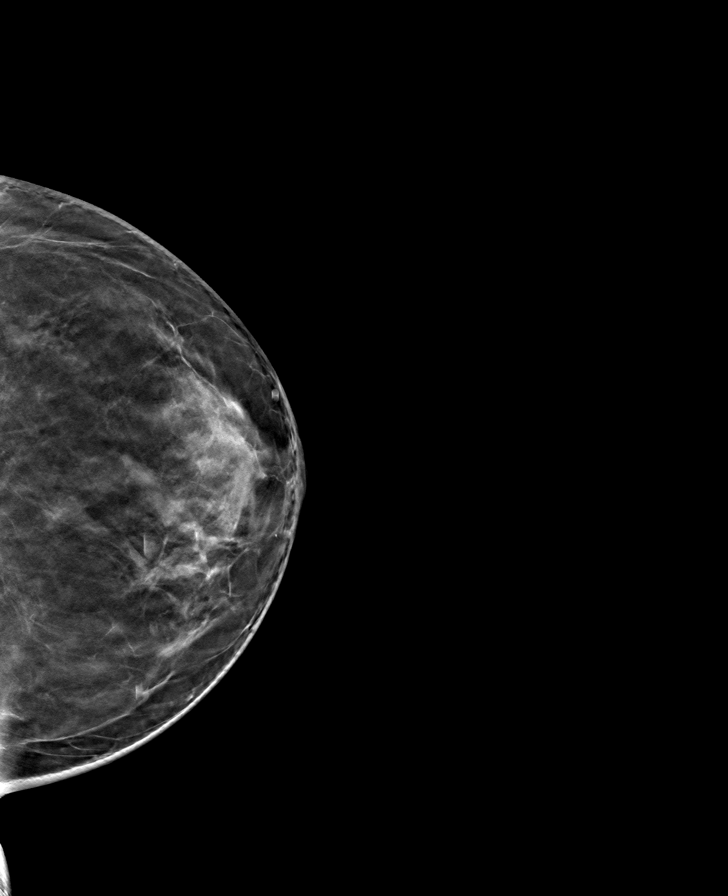

[8 of 24 positions shown; findings below may reference images not displayed]

ACR Breast Density Category c: The breast tissue is heterogeneously
dense, which may obscure small masses.
FINDINGS: There are no findings suspicious for malignancy.
IMPRESSION: No mammographic evidence of malignancy. A result letter of this
screening mammogram will be mailed directly to the patient.

RECOMMENDATION:
Screening mammogram in one year. (Code:[id number])

BI-RADS CATEGORY  1: Negative.

## 2024-06-12 ENCOUNTER — Other Ambulatory Visit: Payer: Self-pay | Admitting: Pediatrics

## 2024-06-12 DIAGNOSIS — Z1231 Encounter for screening mammogram for malignant neoplasm of breast: Secondary | ICD-10-CM

## 2024-07-17 ENCOUNTER — Ambulatory Visit
Admission: RE | Admit: 2024-07-17 | Discharge: 2024-07-17 | Disposition: A | Discharge status: Home / Self Care | Source: Ambulatory Visit | Attending: Pediatrics | Admitting: Pediatrics

## 2024-07-17 DIAGNOSIS — Z1231 Encounter for screening mammogram for malignant neoplasm of breast: Secondary | ICD-10-CM | POA: Insufficient documentation
